# Patient Record
Sex: Male | Born: 1995 | Race: Black or African American | Hispanic: No | Marital: Single | Smoking: Never smoker
Health system: Southern US, Community
[De-identification: ages and names within clinical notes are randomized; demographics above are authoritative.]

## PROBLEM LIST (undated history)

## (undated) DIAGNOSIS — J45909 Unspecified asthma, uncomplicated: Secondary | ICD-10-CM

## (undated) HISTORY — PX: NO PAST SURGERIES: SHX2092

## (undated) HISTORY — DX: Unspecified asthma, uncomplicated: J45.909

---
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
## 2016-11-22 DIAGNOSIS — Z5321 Procedure and treatment not carried out due to patient leaving prior to being seen by health care provider: Secondary | ICD-10-CM | POA: Insufficient documentation

## 2016-11-22 DIAGNOSIS — R109 Unspecified abdominal pain: Secondary | ICD-10-CM | POA: Insufficient documentation

## 2016-11-23 ENCOUNTER — Emergency Department (HOSPITAL_COMMUNITY)
Admission: EM | Admit: 2016-11-23 | Discharge: 2016-11-23 | Disposition: A | Discharge status: Home / Self Care | Payer: Self-pay | Attending: Emergency Medicine | Admitting: Emergency Medicine

## 2016-11-23 ENCOUNTER — Encounter (HOSPITAL_COMMUNITY): Payer: Self-pay | Admitting: Emergency Medicine

## 2016-11-23 LAB — CBC
HEMATOCRIT: 41 % (ref 39.0–52.0)
HEMOGLOBIN: 14.2 g/dL (ref 13.0–17.0)
MCH: 25.2 pg — ABNORMAL LOW (ref 26.0–34.0)
MCHC: 34.6 g/dL (ref 30.0–36.0)
MCV: 72.8 fL — ABNORMAL LOW (ref 78.0–100.0)
Platelets: 159 10*3/uL (ref 150–400)
RBC: 5.63 MIL/uL (ref 4.22–5.81)
RDW: 13.3 % (ref 11.5–15.5)
WBC: 8 10*3/uL (ref 4.0–10.5)

## 2016-11-23 LAB — COMPREHENSIVE METABOLIC PANEL
ALBUMIN: 4.3 g/dL (ref 3.5–5.0)
ALT: 13 U/L — ABNORMAL LOW (ref 17–63)
ANION GAP: 9 (ref 5–15)
AST: 21 U/L (ref 15–41)
Alkaline Phosphatase: 77 U/L (ref 38–126)
BILIRUBIN TOTAL: 0.6 mg/dL (ref 0.3–1.2)
BUN: 14 mg/dL (ref 6–20)
CO2: 24 mmol/L (ref 22–32)
Calcium: 9.3 mg/dL (ref 8.9–10.3)
Chloride: 103 mmol/L (ref 101–111)
Creatinine, Ser: 1.46 mg/dL — ABNORMAL HIGH (ref 0.61–1.24)
GFR calc Af Amer: >60 mL/min (ref >60)
Glucose, Bld: 98 mg/dL (ref 65–99)
Potassium: 3.6 mmol/L (ref 3.5–5.1)
Sodium: 136 mmol/L (ref 135–145)
TOTAL PROTEIN: 7 g/dL (ref 6.5–8.1)

## 2016-11-23 LAB — URINALYSIS, ROUTINE W REFLEX MICROSCOPIC
Bilirubin Urine: NEGATIVE
Glucose, UA: NEGATIVE mg/dL
Hgb urine dipstick: NEGATIVE
Ketones, ur: NEGATIVE mg/dL
LEUKOCYTES UA: NEGATIVE
Nitrite: NEGATIVE
PH: 7 (ref 5.0–8.0)
Protein, ur: NEGATIVE mg/dL
SPECIFIC GRAVITY, URINE: 1.005 (ref 1.005–1.030)

## 2016-11-23 LAB — LIPASE, BLOOD: LIPASE: 152 U/L — AB (ref 11–51)

## 2016-11-23 NOTE — ED Triage Notes (Signed)
Pt c/o n/v and abdominal pain x 2 hours. States he is dehydrated.

## 2016-11-23 NOTE — ED Notes (Signed)
Pt came to nursing desk to use phone. Pt states " he has been waiting so long that he slept off his dehydration. All I have is a little headache and I can take care of that at home". Pt decided to leave AMA

## 2017-02-25 ENCOUNTER — Ambulatory Visit (INDEPENDENT_AMBULATORY_CARE_PROVIDER_SITE_OTHER): Payer: No Typology Code available for payment source | Admitting: Emergency Medicine

## 2017-02-25 ENCOUNTER — Encounter: Payer: Self-pay | Admitting: Emergency Medicine

## 2017-02-25 ENCOUNTER — Other Ambulatory Visit: Payer: Self-pay

## 2017-02-25 VITALS — BP 108/70 | HR 64 | Temp 98.8°F | Resp 16 | Ht 69.0 in | Wt 146.4 lb

## 2017-02-25 DIAGNOSIS — K05219 Aggressive periodontitis, localized, unspecified severity: Secondary | ICD-10-CM | POA: Insufficient documentation

## 2017-02-25 MED ORDER — CLINDAMYCIN HCL 300 MG PO CAPS: 300.0000 mg | ORAL_CAPSULE | Freq: Three times a day (TID) | ORAL | 0 refills | Status: AC

## 2017-02-25 NOTE — Patient Instructions (Addendum)
  Continue Ibuprofen and stop Doxycycline.   IF you received an x-ray today, you will receive an invoice from [facility]. Please contact [facility] at [phone] with questions or concerns regarding your invoice.   IF you received labwork today, you will receive an invoice from [facility]. Please contact [facility] at [phone] with questions or concerns regarding your invoice.   Our billing staff will not be able to assist you with questions regarding bills from these companies.  You will be contacted with the lab results as soon as they are available. The fastest way to get your results is to activate your My Chart account. Instructions are located on the last page of this paperwork. If you have not heard from us regarding the results in 2 weeks, please contact this office.     Dental Abscess A dental abscess is pus in or around a tooth. Follow these instructions at home:  Take medicines only as told by your dentist.  If you were prescribed antibiotic medicine, finish all of it even if you start to feel better.  Rinse your mouth (gargle) often with salt water.  Do not drive or use heavy machinery, like a lawn mower, while taking pain medicine.  Do not apply heat to the outside of your mouth.  Keep all follow-up visits as told by your dentist. This is important. Contact a doctor if:  Your pain is worse, and medicine does not help. Get help right away if:  You have a fever or chills.  Your symptoms suddenly get worse.  You have a very bad headache.  You have problems breathing or swallowing.  You have trouble opening your mouth.  You have puffiness (swelling) in your neck or around your eye. This information is not intended to replace advice given to you by your health care provider. Make sure you discuss any questions you have with your health care provider. Document Released: 08/15/2014 Document Revised: 09/06/2015 Document Reviewed:  03/28/2014 Elsevier Interactive Patient Education  [employer]2018 Elsevier Inc.

## 2017-02-25 NOTE — Progress Notes (Signed)
Jonathon DearJoshua Williamson 21 y.o.   Chief Complaint  Patient presents with  . Jaw Pain    per patient his top tooth x 2 days    HISTORY OF PRESENT ILLNESS: This is a 21 y.o. male complaining of right sided toothache and swelling to right side of face. Denies trauma. [facility].  HPI   Prior to Admission medications   Medication Sig Start Date End Date Taking? Authorizing Provider  ALBUTEROL IN Inhale as needed into the lungs.   Yes [provider]  DOXYCYCLINE PO Take 100 mg 2 (two) times daily by mouth.   Yes [provider]  Fluticasone Propionate, Inhal, (FLOVENT IN) Inhale daily into the lungs.   Yes [provider]  ibuprofen (ADVIL,MOTRIN) 800 MG tablet Take 800 mg every 8 (eight) hours as needed by mouth.   Yes [provider]    Allergies  Allergen Reactions  . Penicillins Hives    There are no active problems to display for this patient.   Past Medical History:  Diagnosis Date  . Asthma     No past surgical history on file.  Social History   Socioeconomic History  . Marital status: Single    Spouse name: Not on file  . Number of children: Not on file  . Years of education: Not on file  . Highest education level: Not on file  Social Needs  . Financial resource strain: Not on file  . Food insecurity - worry: Not on file  . Food insecurity - inability: Not on file  . Transportation needs - medical: Not on file  . Transportation needs - non-medical: Not on file  Occupational History  . Not on file  Tobacco Use  . Smoking status: Light Tobacco Smoker  . Smokeless tobacco: Never Used  Substance and Sexual Activity  . Alcohol use: Yes  . Drug use: Yes    Types: Marijuana  . Sexual activity: Not on file  Other Topics Concern  . Not on file  Social History Narrative  . Not on file    Family History  Problem Relation Age of Onset  . Diabetes Father      Review of  Systems  Constitutional: Negative.  Negative for chills and fever.  HENT: Negative for congestion, nosebleeds, sinus pain and sore throat.   Eyes: Negative for discharge and redness.  Respiratory: Negative for cough and shortness of breath.   Cardiovascular: Negative for chest pain.  Gastrointestinal: Negative for nausea and vomiting.  Skin: Negative for rash.  Neurological: Negative for dizziness and headaches.  Endo/Heme/Allergies: Negative.   All other systems reviewed and are negative.   Vitals:   02/25/17 1415  BP: 108/70  Pulse: 64  Resp: 16  Temp: 98.8 F (37.1 C)  SpO2: 97%    Physical Exam  Constitutional: He is oriented to person, place, and time. He appears well-developed and well-nourished.  HENT:  Head: Normocephalic and atraumatic.    Mouth/Throat:    Eyes: Conjunctivae and EOM are normal. Pupils are equal, round, and reactive to light.  Neck: Normal range of motion. Neck supple.  Cardiovascular: Normal rate and regular rhythm.  Pulmonary/Chest: Effort normal.  Musculoskeletal: Normal range of motion.  Neurological: He is alert and oriented to person, place, and time. No sensory deficit. He exhibits normal muscle tone.  Skin: Skin is warm and dry. Capillary refill takes less than 2 seconds.  Psychiatric: He has a normal mood and affect. His  behavior is normal.  Vitals reviewed.    ASSESSMENT & PLAN: Jonathon Williamson was seen today for jaw pain.  Diagnoses and all orders for this visit:  Acute periodontal abscess  Other orders -     clindamycin (CLEOCIN) 300 MG capsule; Take 1 capsule (300 mg total) 3 (three) times daily for 7 days by mouth.    Patient Instructions    Continue Ibuprofen and stop Doxycycline.   IF you received an x-ray today, you will receive an invoice from [facility]. Please contact [facility] at [phone] with questions or concerns regarding your invoice.   IF you received labwork today, you will receive an  invoice from [facility]. Please contact [facility] at [phone] with questions or concerns regarding your invoice.   Our billing staff will not be able to assist you with questions regarding bills from these companies.  You will be contacted with the lab results as soon as they are available. The fastest way to get your results is to activate your My Chart account. Instructions are located on the last page of this paperwork. If you have not heard from us regarding the results in 2 weeks, please contact this office.     Dental Abscess A dental abscess is pus in or around a tooth. Follow these instructions at home:  Take medicines only as told by your dentist.  If you were prescribed antibiotic medicine, finish all of it even if you start to feel better.  Rinse your mouth (gargle) often with salt water.  Do not drive or use heavy machinery, like a lawn mower, while taking pain medicine.  Do not apply heat to the outside of your mouth.  Keep all follow-up visits as told by your dentist. This is important. Contact a doctor if:  Your pain is worse, and medicine does not help. Get help right away if:  You have a fever or chills.  Your symptoms suddenly get worse.  You have a very bad headache.  You have problems breathing or swallowing.  You have trouble opening your mouth.  You have puffiness (swelling) in your neck or around your eye. This information is not intended to replace advice given to you by your health care provider. Make sure you discuss any questions you have with your health care provider. Document Released: 08/15/2014 Document Revised: 09/06/2015 Document Reviewed: 03/28/2014 Elsevier Interactive Patient Education  2018 Elsevier Inc.       Edwina BarthMiguel Joylene Wescott, MD [facility]

## 2018-11-22 ENCOUNTER — Encounter: Payer: Self-pay | Admitting: Family Medicine

## 2018-11-22 ENCOUNTER — Ambulatory Visit (INDEPENDENT_AMBULATORY_CARE_PROVIDER_SITE_OTHER): Payer: Self-pay | Admitting: Family Medicine

## 2018-11-22 ENCOUNTER — Other Ambulatory Visit: Payer: Self-pay

## 2018-11-22 DIAGNOSIS — Z7689 Persons encountering health services in other specified circumstances: Secondary | ICD-10-CM

## 2018-11-22 DIAGNOSIS — J4540 Moderate persistent asthma, uncomplicated: Secondary | ICD-10-CM

## 2018-11-22 DIAGNOSIS — J302 Other seasonal allergic rhinitis: Secondary | ICD-10-CM

## 2018-11-22 DIAGNOSIS — B009 Herpesviral infection, unspecified: Secondary | ICD-10-CM | POA: Insufficient documentation

## 2018-11-22 MED ORDER — ALBUTEROL SULFATE HFA 108 (90 BASE) MCG/ACT IN AERS: 2.0000 | INHALATION_SPRAY | Freq: Four times a day (QID) | RESPIRATORY_TRACT | 7 refills | Status: DC | PRN

## 2018-11-22 MED ORDER — VALACYCLOVIR HCL 500 MG PO TABS: 500.0000 mg | ORAL_TABLET | ORAL | 3 refills | Status: DC

## 2018-11-22 NOTE — Progress Notes (Signed)
Virtual Visit via Video Note  I connected with Jonathon Williamson on 11/22/18 at  3:00 PM EDT by a video enabled telemedicine application 2/2 [id number]-15 pandemic and verified that I am speaking with the correct person using two identifiers.  Location patient: home Location provider:work or home office Persons participating in the virtual visit: patient, provider  I discussed the limitations of evaluation and management by telemedicine and the availability of in person appointments. The patient expressed understanding and agreed to proceed.   HPI: Pt is a 23 yo male with pmh sig for asthma and HSV II.  [facility] [employer] [facility]  Asthma/allergies: -increased symptoms with allergies -pollen, dust, pet dander are triggers -using albuterol every few days  -using flovent  -taking zyrtec  HSV II: -no current outbreaks -valtrex 500 mg daily.  BID during an outbreak  Social Hx: Studied business at [employer].  Working a Engineer, materials.  Smokes cigars on wknd, maybe 3x/wk/  Drinking EtOH once q 2 wks, but may have 6 shots every few wknds.    Family hx: Dad- DM, HTN Mom-partial blind in one eye  ROS: See pertinent positives and negatives per HPI.  Past Medical History:  Diagnosis Date  . Asthma     No past surgical history on file.  Family History  Problem Relation Age of Onset  . Diabetes Father      Current Outpatient Medications:  .  ALBUTEROL IN, Inhale as needed into the lungs., Disp: , Rfl:  .  DOXYCYCLINE PO, Take 100 mg 2 (two) times daily by mouth., Disp: , Rfl:  .  Fluticasone Propionate, Inhal, (FLOVENT IN), Inhale daily into the lungs., Disp: , Rfl:  .  ibuprofen (ADVIL,MOTRIN) 800 MG tablet, Take 800 mg every 8 (eight) hours as needed by mouth., Disp: , Rfl:   EXAM:  VITALS per patient if applicable: RR between 17-61 bpm  GENERAL: alert, oriented, appears well and in no acute distress  HEENT: atraumatic, conjunctiva clear, no obvious  abnormalities on inspection of external nose and ears  NECK: normal movements of the head and neck  LUNGS: on inspection no signs of respiratory distress, breathing rate appears normal, no obvious gross SOB, gasping or wheezing  CV: no obvious cyanosis  MS: moves all visible extremities without noticeable abnormality  PSYCH/NEURO: pleasant and cooperative, no obvious depression or anxiety, speech and thought processing grossly intact  ASSESSMENT AND PLAN:  Discussed the following assessment and plan:  Moderate persistent asthma without complication -continue flovent and albuterol prn -avoid triggers when able - Plan: albuterol (VENTOLIN HFA) 108 (90 Base) MCG/ACT inhaler  Seasonal allergies  -continue zyrtec   HSV-2 infection  - Plan: valACYclovir (VALTREX) 500 MG tablet  Encounter to establish care  -We reviewed the PMH, PSH, FH, SH, Meds and Allergies. -We provided refills for any medications we will prescribe as needed. -We addressed current concerns per orders and patient instructions. -We have asked for records for pertinent exams, studies, vaccines and notes from previous providers. -We have advised patient to follow up per instructions below.  F/u prn   I discussed the assessment and treatment plan with the patient. The patient was provided an opportunity to ask questions and all were answered. The patient agreed with the plan and demonstrated an understanding of the instructions.   The patient was advised to call back or seek an in-person evaluation if the symptoms worsen or if the condition fails to improve as anticipated.   Billie Ruddy,  MD

## 2019-02-18 ENCOUNTER — Other Ambulatory Visit: Payer: Self-pay | Admitting: Family Medicine

## 2019-02-18 DIAGNOSIS — B009 Herpesviral infection, unspecified: Secondary | ICD-10-CM

## 2019-02-18 MED ORDER — VALACYCLOVIR HCL 500 MG PO TABS: 500.0000 mg | ORAL_TABLET | ORAL | 3 refills | Status: DC

## 2019-02-18 NOTE — Telephone Encounter (Signed)
Copied from [place] [id number]. Topic: Quick Communication - Rx Refill/Question >> Feb 18, 2019  1:39 PM Rainey Pines A wrote: Medication: valACYclovir (VALTREX) 500 MG tablet  Has the patient contacted their pharmacy? Yes (Agent: If no, request that the patient contact the pharmacy for the refill.) (Agent: If yes, when and what did the pharmacy advise?)Contact PCP  Preferred Pharmacy (with phone number or street name): [place], [place] [phone] (Phone) [phone] (Fax)    Agent: Please be advised that RX refills may take up to 3 business days. We ask that you follow-up with your pharmacy.

## 2019-03-02 ENCOUNTER — Telehealth: Payer: Self-pay | Admitting: Family Medicine

## 2019-03-02 NOTE — Telephone Encounter (Signed)
Okay for Nebulizer

## 2019-03-02 NOTE — Telephone Encounter (Signed)
Copied from [place] [id number]. Topic: General - Other >> Mar 02, 2019  3:08 PM Keene Breath wrote: Reason for CRM: Patient called to request an order for a nebulizer due to the flare up of his asthma.  He has been feeling  bad and would like to know if he can get the nebulizer.  CB# [phone]

## 2019-03-03 NOTE — Telephone Encounter (Signed)
Albuterol inhaler just as effective as a nebulizer when used correctly.  No indication for nebulizer.  Advise pt to stop smoking cigars and avoid other triggers.

## 2019-03-08 ENCOUNTER — Other Ambulatory Visit: Payer: Self-pay

## 2019-03-08 NOTE — Telephone Encounter (Signed)
Pt was notified of Dr Volanda Napoleon recommendation, verbalized understanding

## 2019-03-20 ENCOUNTER — Telehealth: Payer: Self-pay | Admitting: *Deleted

## 2019-03-20 NOTE — Telephone Encounter (Signed)
Patient called after hours line this morning. Patient reports he needs an inhaler called in. Shortness of breath. Has gotten inhaler in the past white with orange top. Likes the all blue one, ventolin? Feels like he is struggling for breath, worse when laying down, wakes up 2-3 times a night. Also reports productive cough.  Patient has been advised to proceed to ED.

## 2019-03-21 NOTE — Telephone Encounter (Signed)
Attempted to contact patient twice. Phone gave a busy signal both times.

## 2019-03-21 NOTE — Telephone Encounter (Signed)
Pt has rx for ventolin inhaler sent in 11/2018 with refills.  Needs to contact pharmacy.  If having increased SOB, cough, etc should be seen in person at [facility] or in ED.

## 2019-07-28 ENCOUNTER — Other Ambulatory Visit: Payer: Self-pay

## 2019-07-28 ENCOUNTER — Telehealth: Payer: Self-pay | Admitting: General Practice

## 2019-07-28 DIAGNOSIS — J4540 Moderate persistent asthma, uncomplicated: Secondary | ICD-10-CM

## 2019-07-28 MED ORDER — ALBUTEROL SULFATE HFA 108 (90 BASE) MCG/ACT IN AERS: 2.0000 | INHALATION_SPRAY | Freq: Four times a day (QID) | RESPIRATORY_TRACT | 3 refills | Status: DC | PRN

## 2019-07-28 NOTE — Telephone Encounter (Signed)
Pt is requesting a refill on Albuterol inhaler. Pt has not had his inhaler for a week. Pt uses [facility]. Thanks

## 2019-07-28 NOTE — Telephone Encounter (Signed)
Refill sent to pt pharmacy 

## 2020-03-22 ENCOUNTER — Other Ambulatory Visit: Payer: Self-pay | Admitting: Family Medicine

## 2020-03-22 DIAGNOSIS — B009 Herpesviral infection, unspecified: Secondary | ICD-10-CM

## 2020-03-22 NOTE — Telephone Encounter (Signed)
Pt needs appointment for further refill 

## 2020-07-21 ENCOUNTER — Encounter (HOSPITAL_BASED_OUTPATIENT_CLINIC_OR_DEPARTMENT_OTHER): Payer: Self-pay | Admitting: *Deleted

## 2020-07-21 ENCOUNTER — Emergency Department (HOSPITAL_BASED_OUTPATIENT_CLINIC_OR_DEPARTMENT_OTHER)
Admission: EM | Admit: 2020-07-21 | Discharge: 2020-07-22 | Disposition: A | Discharge status: Home / Self Care | Payer: Self-pay | Attending: Emergency Medicine | Admitting: Emergency Medicine

## 2020-07-21 ENCOUNTER — Other Ambulatory Visit: Payer: Self-pay

## 2020-07-21 ENCOUNTER — Emergency Department (HOSPITAL_BASED_OUTPATIENT_CLINIC_OR_DEPARTMENT_OTHER): Payer: Self-pay

## 2020-07-21 DIAGNOSIS — R0789 Other chest pain: Secondary | ICD-10-CM | POA: Insufficient documentation

## 2020-07-21 DIAGNOSIS — J4541 Moderate persistent asthma with (acute) exacerbation: Secondary | ICD-10-CM

## 2020-07-21 DIAGNOSIS — R000 Tachycardia, unspecified: Secondary | ICD-10-CM | POA: Insufficient documentation

## 2020-07-21 DIAGNOSIS — R062 Wheezing: Secondary | ICD-10-CM | POA: Insufficient documentation

## 2020-07-21 DIAGNOSIS — R059 Cough, unspecified: Secondary | ICD-10-CM | POA: Insufficient documentation

## 2020-07-21 DIAGNOSIS — R0602 Shortness of breath: Secondary | ICD-10-CM | POA: Insufficient documentation

## 2020-07-21 DIAGNOSIS — Z7951 Long term (current) use of inhaled steroids: Secondary | ICD-10-CM | POA: Insufficient documentation

## 2020-07-21 LAB — BASIC METABOLIC PANEL
Anion gap: 9 (ref 5–15)
BUN: 15 mg/dL (ref 6–20)
CO2: 26 mmol/L (ref 22–32)
Calcium: 8.7 mg/dL — ABNORMAL LOW (ref 8.9–10.3)
Chloride: 103 mmol/L (ref 98–111)
Creatinine, Ser: 1.28 mg/dL — ABNORMAL HIGH (ref 0.61–1.24)
GFR, Estimated: >60 mL/min (ref >60)
Glucose, Bld: 98 mg/dL (ref 70–99)
Potassium: 3.6 mmol/L (ref 3.5–5.1)
Sodium: 138 mmol/L (ref 135–145)

## 2020-07-21 LAB — CBC WITH DIFFERENTIAL/PLATELET
Abs Immature Granulocytes: 0.02 10*3/uL (ref 0.00–0.07)
Basophils Absolute: 0 10*3/uL (ref 0.0–0.1)
Basophils Relative: 0 %
Eosinophils Absolute: 0.2 10*3/uL (ref 0.0–0.5)
Eosinophils Relative: 2 %
HCT: 43.3 % (ref 39.0–52.0)
Hemoglobin: 14.1 g/dL (ref 13.0–17.0)
Immature Granulocytes: 0 %
Lymphocytes Relative: 25 %
Lymphs Abs: 1.7 10*3/uL (ref 0.7–4.0)
MCH: 25 pg — ABNORMAL LOW (ref 26.0–34.0)
MCHC: 32.6 g/dL (ref 30.0–36.0)
MCV: 76.9 fL — ABNORMAL LOW (ref 80.0–100.0)
Monocytes Absolute: 0.5 10*3/uL (ref 0.1–1.0)
Monocytes Relative: 7 %
Neutro Abs: 4.5 10*3/uL (ref 1.7–7.7)
Neutrophils Relative %: 66 %
Platelets: 162 10*3/uL (ref 150–400)
RBC: 5.63 MIL/uL (ref 4.22–5.81)
RDW: 13.4 % (ref 11.5–15.5)
WBC: 6.8 10*3/uL (ref 4.0–10.5)
nRBC: 0 % (ref 0.0–0.2)

## 2020-07-21 MED ORDER — SODIUM CHLORIDE 0.9 % IV BOLUS
500.0000 mL | Freq: Once | INTRAVENOUS | Status: AC
  Administered 2020-07-21: 500 mL via INTRAVENOUS

## 2020-07-21 MED ORDER — ALBUTEROL SULFATE (2.5 MG/3ML) 0.083% IN NEBU
INHALATION_SOLUTION | RESPIRATORY_TRACT | Status: AC
Start: 2020-07-21 — End: 2020-07-21
  Administered 2020-07-21: 2.5 mg via RESPIRATORY_TRACT
  Filled 2020-07-21: qty 3

## 2020-07-21 MED ORDER — IPRATROPIUM-ALBUTEROL 0.5-2.5 (3) MG/3ML IN SOLN: 3.0000 mL | Freq: Once | RESPIRATORY_TRACT | Status: DC

## 2020-07-21 MED ORDER — ALBUTEROL SULFATE (2.5 MG/3ML) 0.083% IN NEBU: 2.5000 mg | INHALATION_SOLUTION | Freq: Once | RESPIRATORY_TRACT | Status: AC

## 2020-07-21 MED ORDER — MAGNESIUM SULFATE 2 GM/50ML IV SOLN
2.0000 g | Freq: Once | INTRAVENOUS | Status: AC
  Administered 2020-07-21: 2 g via INTRAVENOUS
  Filled 2020-07-21: qty 50

## 2020-07-21 MED ORDER — ALBUTEROL SULFATE HFA 108 (90 BASE) MCG/ACT IN AERS: 2.0000 | INHALATION_SPRAY | Freq: Once | RESPIRATORY_TRACT | Status: DC

## 2020-07-21 MED ORDER — ALBUTEROL SULFATE (2.5 MG/3ML) 0.083% IN NEBU: 5.0000 mg | INHALATION_SOLUTION | Freq: Once | RESPIRATORY_TRACT | Status: AC

## 2020-07-21 MED ORDER — ALBUTEROL (5 MG/ML) CONTINUOUS INHALATION SOLN
10.0000 mg/h | INHALATION_SOLUTION | RESPIRATORY_TRACT | Status: AC
  Administered 2020-07-21: 10 mg/h via RESPIRATORY_TRACT
  Filled 2020-07-21: qty 20

## 2020-07-21 MED ORDER — IPRATROPIUM-ALBUTEROL 0.5-2.5 (3) MG/3ML IN SOLN: 3.0000 mL | Freq: Once | RESPIRATORY_TRACT | Status: AC

## 2020-07-21 MED ORDER — IPRATROPIUM-ALBUTEROL 0.5-2.5 (3) MG/3ML IN SOLN
RESPIRATORY_TRACT | Status: AC
  Administered 2020-07-21: 3 mL via RESPIRATORY_TRACT
  Filled 2020-07-21: qty 3

## 2020-07-21 MED ORDER — ALBUTEROL SULFATE HFA 108 (90 BASE) MCG/ACT IN AERS
INHALATION_SPRAY | RESPIRATORY_TRACT | Status: AC
  Administered 2020-07-21: 2 via RESPIRATORY_TRACT
  Filled 2020-07-21: qty 6.7

## 2020-07-21 MED ORDER — ALBUTEROL SULFATE (2.5 MG/3ML) 0.083% IN NEBU
INHALATION_SOLUTION | RESPIRATORY_TRACT | Status: AC
  Administered 2020-07-21: 5 mg via RESPIRATORY_TRACT
  Filled 2020-07-21: qty 6

## 2020-07-21 MED ORDER — PREDNISONE 10 MG PO TABS: 40.0000 mg | ORAL_TABLET | Freq: Every day | ORAL | 0 refills | Status: DC

## 2020-07-21 MED ORDER — ALBUTEROL SULFATE HFA 108 (90 BASE) MCG/ACT IN AERS: 2.0000 | INHALATION_SPRAY | Freq: Four times a day (QID) | RESPIRATORY_TRACT | Status: DC | PRN

## 2020-07-21 MED ORDER — PREDNISONE 20 MG PO TABS
40.0000 mg | ORAL_TABLET | Freq: Once | ORAL | Status: AC
  Administered 2020-07-21: 40 mg via ORAL
  Filled 2020-07-21: qty 2

## 2020-07-21 NOTE — ED Triage Notes (Signed)
Pt reports hx of asthma. 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 Used home inhaler without relief. Eval by RT in triage

## 2020-07-21 NOTE — ED Provider Notes (Signed)
[facility] EMERGENCY DEPARTMENT Provider Note   CSN: [id number] Arrival date & time: 07/21/20  2108     History Chief Complaint  Patient presents with  . Asthma    Kaelob Persky is a 25 y.o. male.  The history is provided by the patient and medical records.  Asthma   Sota Hetz is a 25 y.o. male who presents to the Emergency Department complaining of shortness of breath. He has a history of asthma and normally uses a rescue inhaler 3 to 4 times weekly. 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 He has chest tightness with mild cough. He tried his rescue inhaler with no significant improvement in symptoms. He denies any fevers, nausea, vomiting, abdominal pain, leg swelling or pain. Two years ago he was on Flovent but he lost his insurance and has not been able to follow-up with PCP since that time. He does smoke tobacco. No recent illnesses.    Past Medical History:  Diagnosis Date  . Asthma     Patient Active Problem List   Diagnosis Date Noted  . Seasonal allergies 11/22/2018  . HSV-2 infection 11/22/2018  . Moderate persistent asthma without complication 11/22/2018  . Acute periodontal abscess 02/25/2017    No past surgical history on file.     Family History  Problem Relation Age of Onset  . Diabetes Father     Social History   Tobacco Use  . Smoking status: Never Smoker  . Smokeless tobacco: Never Used  Substance Use Topics  . Alcohol use: Yes  . Drug use: Yes    Types: Marijuana    Home Medications Prior to Admission medications   Medication Sig Start Date End Date Taking? Authorizing Provider  albuterol (VENTOLIN HFA) 108 (90 Base) MCG/ACT inhaler Inhale 2 puffs into the lungs every 6 (six) hours as needed for wheezing or shortness of breath. 07/28/19   Deeann Saint, MD  ALBUTEROL IN Inhale as needed into the lungs.    [provider]  DOXYCYCLINE PO  Take 100 mg 2 (two) times daily by mouth.    [provider]  Fluticasone Propionate, Inhal, (FLOVENT IN) Inhale daily into the lungs.    [provider]  ibuprofen (ADVIL,MOTRIN) 800 MG tablet Take 800 mg every 8 (eight) hours as needed by mouth.    [provider]  valACYclovir (VALTREX) 500 MG tablet TAKE 1 TABLET BY MOUTH AS DIRECTED 03/22/20   Deeann Saint, MD    Allergies    Penicillins  Review of Systems   Review of Systems  All other systems reviewed and are negative.   Physical Exam Updated Vital Signs BP (!) 146/80   Pulse (!) 106   Temp 98.1 F (36.7 C) (Oral)   Resp 20   Ht 5\' 9"  (1.753 m)   Wt 68 kg   SpO2 99%   BMI 22.15 kg/m   Physical Exam Vitals and nursing note reviewed.  Constitutional:      Appearance: He is well-developed.  HENT:     Head: Normocephalic and atraumatic.  Cardiovascular:     Rate and Rhythm: Regular rhythm. Tachycardia present.     Heart sounds: No murmur heard.   Pulmonary:     Effort: Pulmonary effort is normal. No respiratory distress.     Comments: Good air movement bilaterally. end expiratory wheezes bilaterally Abdominal:     Palpations: Abdomen is soft.  Tenderness: There is no abdominal tenderness. There is no guarding or rebound.  Musculoskeletal:        General: No tenderness.  Skin:    General: Skin is warm and dry.  Neurological:     Mental Status: He is alert and oriented to person, place, and time.  Psychiatric:        Behavior: Behavior normal.     ED Results / Procedures / Treatments   Labs (all labs ordered are listed, but only abnormal results are displayed) Labs Reviewed - No data to display  EKG None  Radiology No results found.  Procedures Procedures   Medications Ordered in ED Medications  albuterol (PROVENTIL) (2.5 MG/3ML) 0.083% nebulizer solution 5 mg (has no administration in time range)  albuterol (VENTOLIN HFA) 108 (90 Base) MCG/ACT inhaler 2 puff  (has no administration in time range)  albuterol (PROVENTIL) (2.5 MG/3ML) 0.083% nebulizer solution (has no administration in time range)  albuterol (VENTOLIN HFA) 108 (90 Base) MCG/ACT inhaler (has no administration in time range)  ipratropium-albuterol (DUONEB) 0.5-2.5 (3) MG/3ML nebulizer solution 3 mL (3 mLs Nebulization Given 07/21/20 2130)  albuterol (PROVENTIL) (2.5 MG/3ML) 0.083% nebulizer solution 2.5 mg (2.5 mg Nebulization Given 07/21/20 2130)  predniSONE (DELTASONE) tablet 40 mg (40 mg Oral Given 07/21/20 2209)    ED Course  I have reviewed the triage vital signs and the nursing notes.  Pertinent labs & imaging results that were available during my care of the patient were reviewed by me and considered in my medical decision making (see chart for details).    MDM Rules/Calculators/A&P                         Patient with history of asthma here for evaluation of cough, shortness of breath. On initial presentation patient with end expiratory wheezes. Repeat assessment after albuterol patient without significant improvement, has persistent wheezing and is diminished on repeat evaluation. Plan to treat with hour-long neb and reassess. Will add on IV magnesium, chest x-ray due to failure to significantly improve following initial treatment. Patient care transferred pending recheck after hour-long neb.  Final Clinical Impression(s) / ED Diagnoses Final diagnoses:  None    Rx / DC Orders ED Discharge Orders    None       Tilden Fossa, MD 07/22/20 0001

## 2020-07-24 NOTE — Progress Notes (Signed)
.   Transition of Care [facility]) - Emergency Department Mini Assessment   Patient Details  Name: Jonathon Williamson MRN: [id number] Date of Birth: 09-09-1995  [facility]) CM/SW Contact:    Elliot Cousin, RN Phone Number: [phone]  07/24/2020, 4:55 PM   Clinical Narrative:  TOC CM spoke to pt and he did pick up his medications. Appt arranged for [facility] for 5/26 at 1:50 pm. Updated pt that he can utilize the [facility] pharmacy for his meds at a discounted price. Contacted pt with appt time. ED provider updated.   ED Mini Assessment: What brought you to the Emergency Department? : asthma  Barriers to Discharge: No Barriers Identified  Barrier interventions: discussed follow up with PCP, and medications  Means of departure: Car  Interventions which prevented an admission or readmission: Medication Review,Follow-up medical appointment    Patient Contact and Communications        ,                 Admission diagnosis:  asthma Patient Active Problem List   Diagnosis Date Noted  . Seasonal allergies 11/22/2018  . HSV-2 infection 11/22/2018  . Moderate persistent asthma without complication 11/22/2018  . Acute periodontal abscess 02/25/2017   PCP:  Deeann Saint, MD Pharmacy:   [facility] [phone] - [place], [place] - [address] AT [facility] [address] [place] [address] Phone: [phone] Fax: [phone]  [facility] [place], [place] - [address] [place] [address] Phone: [phone] Fax: [phone]  [facility], [place] - [address] [facility] [place] [address] Phone: [phone] Fax: [phone]  [id number] [place], [place] - [address] AT [facility] OF [address] [place] [address] Phone: [phone] Fax:  [phone]

## 2020-07-25 ENCOUNTER — Other Ambulatory Visit: Payer: Self-pay | Admitting: Family Medicine

## 2020-07-25 DIAGNOSIS — B009 Herpesviral infection, unspecified: Secondary | ICD-10-CM

## 2020-07-25 NOTE — Telephone Encounter (Signed)
Okay to refill?  LOV 11/22/18  Last refill 03/22/20

## 2020-09-04 NOTE — Progress Notes (Signed)
Subjective:    Jonathon Williamson - 25 y.o. male MRN [id number]  Date of birth: Jul 28, 1995  HPI  [name] is to establish care and hospital discharge follow-up. Patient has a PMH significant for moderate persistent asthma without complication, acute periodontal abscess, seasonal allergies, and HSV-2 infection.   Visit 07/21/2020 - 07/22/2020 at [facility] Emergency Department per MD note:  Patient with history of asthma here for evaluation of cough, shortness of breath. On initial presentation patient with end expiratory wheezes. Repeat assessment after albuterol patient without significant improvement, has persistent wheezing and is diminished on repeat evaluation. Plan to treat with hour-long neb and reassess. Will add on IV magnesium, chest x-ray due to failure to significantly improve following initial treatment. Patient care transferred pending recheck after hour-long neb.  09/05/2020 ASTHMA FOLLOW-UP: Albuterol/rescue inhaler frequency: about 8 times weekly, requesting to add Flovent Dyspnea frequency: since hospital discharge back to baseline Wheezing frequency: since hospital discharge back to baseline  Cough frequency: since hospital discharge back to baseline  Limitation of activity: no Triggers: dust, pollen, pet dander, cigarette smoke, strong perfume   Comments: Requesting refills on Flovent and Albuterol inhalers. Reports has not taken Flovent inhaler for several years because he lost follow-up of care with previous primary provider. Reports overall feeling well today.  2. FINANCIAL CONCERN: Requesting information about health insurance assistance.   ROS per HPI    Health Maintenance:  Health Maintenance Due  Topic Date Due  . COVID-19 Vaccine (1) Never done  . HPV VACCINES (1 - Male 2-dose series) Never done  . HIV Screening  Never done  . Hepatitis C Screening  Never done  . TETANUS/TDAP  Never done    Past Medical History: Patient Active Problem  List   Diagnosis Date Noted  . Seasonal allergies 11/22/2018  . HSV-2 infection 11/22/2018  . Moderate persistent asthma without complication 11/22/2018  . Acute periodontal abscess 02/25/2017    Social History   reports that he has never smoked. He has never used smokeless tobacco. He reports current alcohol use of about 2.0 standard drinks of alcohol per week. He reports current drug use. Drug: Marijuana.   Family History  family history includes Diabetes in his father.   Medications: reviewed and updated   Objective:   Physical Exam BP 135/89 (BP Location: Left Arm, Patient Position: Sitting, Cuff Size: Normal)   Pulse 60   Temp 98.4 F (36.9 C)   Resp 14   Ht 5' 8.94" (1.751 m)   Wt 143 lb (64.9 kg)   SpO2 99%   BMI 21.16 kg/m  Physical Exam Constitutional:      Appearance: He is normal weight.  HENT:     Head: Normocephalic and atraumatic.  Eyes:     Extraocular Movements: Extraocular movements intact.     Conjunctiva/sclera: Conjunctivae normal.     Pupils: Pupils are equal, round, and reactive to light.  Cardiovascular:     Rate and Rhythm: Normal rate and regular rhythm.     Pulses: Normal pulses.     Heart sounds: Normal heart sounds.  Pulmonary:     Effort: Pulmonary effort is normal.     Breath sounds: Normal breath sounds.  Musculoskeletal:     Cervical back: Normal range of motion and neck supple.  Neurological:     General: No focal deficit present.     Mental Status: He is alert and oriented to person, place, and time.  Psychiatric:  Mood and Affect: Mood normal.        Behavior: Behavior normal.      Assessment & Plan:  1. Encounter to establish care: - Patient presents today to establish care.  - Return for annual physical examination, labs, and health maintenance. Arrive fasting meaning having no food for at least 8 hours prior to appointment. You may have only water or black coffee. Please take scheduled medications as normal.  2.  Hospital discharge follow-up: 3. Moderate persistent asthma without complication: - Patient reports feeling well since hospital discharge.  - Continue Albuterol inhaler as prescribed.  - Begin Fluticasone inhaler as prescribed.  - Follow-up with primary provider as scheduled.  - albuterol (VENTOLIN HFA) 108 (90 Base) MCG/ACT inhaler; Inhale 2 puffs into the lungs every 6 (six) hours as needed for wheezing or shortness of breath.  Dispense: 8 g; Refill: 1 - fluticasone (FLOVENT HFA) 110 MCG/ACT inhaler; Inhale 2 puffs into the lungs daily.  Dispense: 1 each; Refill: 12  4. Financial difficulty: - Offered patient [employer] financial discount/orange card and blue card application. Counseled patient will need to have an appointment with the financial counselor for processing of documentation. Patient agreeable.     Patient was given clear instructions to go to Emergency Department or return to medical center if symptoms don't improve, worsen, or new problems develop.The patient verbalized understanding.  I discussed the assessment and treatment plan with the patient. The patient was provided an opportunity to ask questions and all were answered. The patient agreed with the plan and demonstrated an understanding of the instructions.   The patient was advised to call back or seek an in-person evaluation if the symptoms worsen or if the condition fails to improve as anticipated.    Ricky Stabs, NP 09/05/2020, 2:13 PM Primary Care at [facility]

## 2020-09-05 ENCOUNTER — Encounter: Payer: Self-pay | Admitting: Family

## 2020-09-05 ENCOUNTER — Other Ambulatory Visit: Payer: Self-pay

## 2020-09-05 ENCOUNTER — Ambulatory Visit (INDEPENDENT_AMBULATORY_CARE_PROVIDER_SITE_OTHER): Payer: Self-pay | Admitting: Family

## 2020-09-05 VITALS — BP 135/89 | HR 60 | Temp 98.4°F | Resp 14 | Ht 68.94 in | Wt 143.0 lb

## 2020-09-05 DIAGNOSIS — Z599 Problem related to housing and economic circumstances, unspecified: Secondary | ICD-10-CM

## 2020-09-05 DIAGNOSIS — Z7689 Persons encountering health services in other specified circumstances: Secondary | ICD-10-CM

## 2020-09-05 DIAGNOSIS — J4540 Moderate persistent asthma, uncomplicated: Secondary | ICD-10-CM

## 2020-09-05 DIAGNOSIS — Z09 Encounter for follow-up examination after completed treatment for conditions other than malignant neoplasm: Secondary | ICD-10-CM

## 2020-09-05 MED ORDER — ALBUTEROL SULFATE HFA 108 (90 BASE) MCG/ACT IN AERS
2.0000 | INHALATION_SPRAY | Freq: Four times a day (QID) | RESPIRATORY_TRACT | 1 refills | Status: DC | PRN
  Filled 2020-09-05: qty 18, 25d supply, fill #0
  Filled 2020-11-07: qty 18, 25d supply, fill #1

## 2020-09-05 MED ORDER — FLUTICASONE PROPIONATE HFA 110 MCG/ACT IN AERO
2.0000 | INHALATION_SPRAY | Freq: Every day | RESPIRATORY_TRACT | 12 refills | Status: DC
  Filled 2020-09-05: qty 12, 30d supply, fill #0
  Filled 2020-09-17 (×2): qty 12, 30d supply, fill #1

## 2020-09-05 NOTE — Patient Instructions (Signed)
Asthma, Adult  Asthma is a long-term (chronic) condition in which the airways get tight and narrow. The airways are the breathing passages that lead from the nose and mouth down into the lungs. A person with asthma will have times when symptoms get worse. These are called asthma attacks. They can cause coughing, whistling sounds when you breathe (wheezing), shortness of breath, and chest pain. They can make it hard to breathe. There is no cure for asthma, but medicines and lifestyle changes can help control it. There are many things that can bring on an asthma attack or make asthma symptoms worse (triggers). Common triggers include:  Mold.  Dust.  Cigarette smoke.  Cockroaches.  Things that can cause allergy symptoms (allergens). These include animal skin flakes (dander) and pollen from trees or grass.  Things that pollute the air. These may include household cleaners, wood smoke, smog, or chemical odors.  Cold air, weather changes, and wind.  Crying or laughing hard.  Stress.  Certain medicines or drugs.  Certain foods such as dried fruit, potato chips, and grape juice.  Infections, such as a cold or the flu.  Certain medical conditions or diseases.  Exercise or tiring activities. Asthma may be treated with medicines and by staying away from the things that cause asthma attacks. Types of medicines may include:  Controller medicines. These help prevent asthma symptoms. They are usually taken every day.  Fast-acting reliever or rescue medicines. These quickly relieve asthma symptoms. They are used as needed and provide short-term relief.  Allergy medicines if your attacks are brought on by allergens.  Medicines to help control the body's defense (immune) system. Follow these instructions at home: Avoiding triggers in your home  Change your heating and air conditioning filter often.  Limit your use of fireplaces and wood stoves.  Get rid of pests (such as roaches and  mice) and their droppings.  Throw away plants if you see mold on them.  Clean your floors. Dust regularly. Use cleaning products that do not smell.  Have someone vacuum when you are not home. Use a vacuum cleaner with a HEPA filter if possible.  Replace carpet with wood, tile, or vinyl flooring. Carpet can trap animal skin flakes and dust.  Use allergy-proof pillows, mattress covers, and box spring covers.  Wash bed sheets and blankets every week in hot water. Dry them in a dryer.  Keep your bedroom free of any triggers.  Avoid pets and keep windows closed when things that cause allergy symptoms are in the air.  Use blankets that are made of polyester or cotton.  Clean bathrooms and kitchens with bleach. If possible, have someone repaint the walls in these rooms with mold-resistant paint. Keep out of the rooms that are being cleaned and painted.  Wash your hands often with soap and water. If soap and water are not available, use hand sanitizer.  Do not allow anyone to smoke in your home. General instructions  Take over-the-counter and prescription medicines only as told by your doctor. ? Talk with your doctor if you have questions about how or when to take your medicines. ? Make note if you need to use your medicines more often than usual.  Do not use any products that contain nicotine or tobacco, such as cigarettes and e-cigarettes. If you need help quitting, ask your doctor.  Stay away from secondhand smoke.  Avoid doing things outdoors when allergen counts are high and when air quality is low.  Wear a ski mask   when doing outdoor activities in the winter. The mask should cover your nose and mouth. Exercise indoors on cold days if you can.  Warm up before you exercise. Take time to cool down after exercise.  Use a peak flow meter as told by your doctor. A peak flow meter is a tool that measures how well the lungs are working.  Keep track of the peak flow meter's readings.  Write them down.  Follow your asthma action plan. This is a written plan for taking care of your asthma and treating your attacks.  Make sure you get all the shots (vaccines) that your doctor recommends. Ask your doctor about a flu shot and a pneumonia shot.  Keep all follow-up visits as told by your doctor. This is important. Contact a doctor if:  You have wheezing, shortness of breath, or a cough even while taking medicine to prevent attacks.  The mucus you cough up (sputum) is thicker than usual.  The mucus you cough up changes from clear or white to yellow, green, gray, or bloody.  You have problems from the medicine you are taking, such as: ? A rash. ? Itching. ? Swelling. ? Trouble breathing.  You need reliever medicines more than 2-3 times a week.  Your peak flow reading is still at 50-79% of your personal best after following the action plan for 1 hour.  You have a fever. Get help right away if:  You seem to be worse and are not responding to medicine during an asthma attack.  You are short of breath even at rest.  You get short of breath when doing very little activity.  You have trouble eating, drinking, or talking.  You have chest pain or tightness.  You have a fast heartbeat.  Your lips or fingernails start to turn blue.  You are light-headed or dizzy, or you faint.  Your peak flow is less than 50% of your personal best.  You feel too tired to breathe normally. Summary  Asthma is a long-term (chronic) condition in which the airways get tight and narrow. An asthma attack can make it hard to breathe.  Asthma cannot be cured, but medicines and lifestyle changes can help control it.  Make sure you understand how to avoid triggers and how and when to use your medicines. This information is not intended to replace advice given to you by your health care provider. Make sure you discuss any questions you have with your health care provider. Document Revised:  08/03/2019 Document Reviewed: 08/03/2019 Elsevier Patient Education  2021 Elsevier Inc.   

## 2020-09-05 NOTE — Progress Notes (Signed)
ED f/u  Pt needs Flovent inhaler refilled and sent into [facility] pharmacy -did not have insurance coverage to have refilled  Needs albuterol rescue inhaler refilled as well

## 2020-09-06 ENCOUNTER — Inpatient Hospital Stay: Payer: Medicaid Other | Admitting: Physician Assistant

## 2020-09-09 NOTE — Progress Notes (Signed)
Virtual Visit via Telephone Note  I connected with Jonathon Williamson, on 09/11/2020 at 12:11 PM by telephone due to the COVID-19 pandemic and verified that I am speaking with the correct person using two identifiers.  Due to current restrictions/limitations of in-office visits due to the COVID-19 pandemic, this scheduled clinical appointment was converted to a telehealth visit.   Consent: I discussed the limitations, risks, security and privacy concerns of performing an evaluation and management service by telephone and the availability of in person appointments. I also discussed with the patient that there may be a patient responsible charge related to this service. The patient expressed understanding and agreed to proceed.   Location of Patient: Home  Location of Provider: [facility]   Persons participating in Telemedicine visit: Gerrell Tabet Ricky Stabs, NP Margorie John, CMA  History of Present Illness: Jonathon Williamson is a 24 year-old male who presents to establish care. PMH significant for asthma, moderate persistent asthma without complication, acute periodontal abscess, seasonal allergies, and HSV-2 infection.   Current issues and/or concerns: None.    Past Medical History:  Diagnosis Date  . Asthma    Allergies  Allergen Reactions  . Peanut Oil Itching  . Penicillins Hives    Current Outpatient Medications on File Prior to Visit  Medication Sig Dispense Refill  . albuterol (VENTOLIN HFA) 108 (90 Base) MCG/ACT inhaler Inhale 2 puffs into the lungs every 6 (six) hours as needed for wheezing or shortness of breath. 18 g 1  . fluticasone (FLOVENT HFA) 110 MCG/ACT inhaler Inhale 2 puffs into the lungs daily. 12 g 12  . ibuprofen (ADVIL,MOTRIN) 800 MG tablet Take 800 mg every 8 (eight) hours as needed by mouth.    . valACYclovir (VALTREX) 500 MG tablet TAKE ONE TABLET BY MOUTH DAILY AS DIRECTED 90 tablet 0   No current  facility-administered medications on file prior to visit.    Observations/Objective: Alert and oriented x 3. Not in acute distress. Physical examination not completed as this is a telemedicine visit.  Assessment and Plan: 1. Encounter to establish care: - Patient presents today to establish care.  - Return for annual physical examination, labs, and health maintenance. Arrive fasting meaning having no food for at least 8 hours prior to appointment. You may have only water or black coffee. Please take scheduled medications as normal.   Follow Up Instructions: Return for annual physical exam.    Patient was given clear instructions to go to Emergency Department or return to medical center if symptoms don't improve, worsen, or new problems develop.The patient verbalized understanding.  I discussed the assessment and treatment plan with the patient. The patient was provided an opportunity to ask questions and all were answered. The patient agreed with the plan and demonstrated an understanding of the instructions.   The patient was advised to call back or seek an in-person evaluation if the symptoms worsen or if the condition fails to improve as anticipated.   I provided 5 minutes total of non-face-to-face time during this encounter.   Rema Fendt, NP  [facility] Primary Care at [facility] [place], [place] [phone] 09/11/2020, 12:11 PM

## 2020-09-11 ENCOUNTER — Encounter: Payer: Self-pay | Admitting: Family

## 2020-09-11 ENCOUNTER — Telehealth (INDEPENDENT_AMBULATORY_CARE_PROVIDER_SITE_OTHER): Payer: Self-pay | Admitting: Family

## 2020-09-11 ENCOUNTER — Other Ambulatory Visit: Payer: Self-pay

## 2020-09-11 DIAGNOSIS — Z7689 Persons encountering health services in other specified circumstances: Secondary | ICD-10-CM

## 2020-09-11 NOTE — Progress Notes (Signed)
Establish care Pt needs physical  

## 2020-09-17 ENCOUNTER — Ambulatory Visit: Payer: Self-pay

## 2020-09-17 ENCOUNTER — Other Ambulatory Visit: Payer: Self-pay

## 2020-10-16 NOTE — Progress Notes (Signed)
Patient ID: Jonathon Williamson, male    DOB: 1995-05-04  MRN: [id number]  CC: Annual Physical Exam  Subjective: Jonathon Williamson is a 25 y.o. male who presents for annual physical exam.   His concerns today include:   Requesting STD testing, denies any symptoms. Requesting refill of Valtrex, endorses genital related herpes.   Patient Active Problem List   Diagnosis Date Noted  . Seasonal allergies 11/22/2018  . HSV-2 infection 11/22/2018  . Moderate persistent asthma without complication 11/22/2018  . Acute periodontal abscess 02/25/2017     Current Outpatient Medications on File Prior to Visit  Medication Sig Dispense Refill  . albuterol (VENTOLIN HFA) 108 (90 Base) MCG/ACT inhaler Inhale 2 puffs into the lungs every 6 (six) hours as needed for wheezing or shortness of breath. 18 g 1  . fluticasone (FLOVENT HFA) 110 MCG/ACT inhaler Inhale 2 puffs into the lungs daily. 12 g 12  . ibuprofen (ADVIL,MOTRIN) 800 MG tablet Take 800 mg every 8 (eight) hours as needed by mouth.     No current facility-administered medications on file prior to visit.    Allergies  Allergen Reactions  . Peanut Oil Itching  . Penicillins Hives    Social History   Socioeconomic History  . Marital status: Single    Spouse name: Not on file  . Number of children: Not on file  . Years of education: Not on file  . Highest education level: Not on file  Occupational History  . Not on file  Tobacco Use  . Smoking status: Never  . Smokeless tobacco: Never  Vaping Use  . Vaping Use: Never used  Substance and Sexual Activity  . Alcohol use: Yes    Alcohol/week: 2.0 standard drinks    Types: 2 Standard drinks or equivalent per week  . Drug use: Yes    Types: Marijuana  . Sexual activity: Not on file  Other Topics Concern  . Not on file  Social History Narrative  . Not on file   Social Determinants of Health   Financial Resource Strain: Not on file  Food Insecurity: Not on file   Transportation Needs: Not on file  Physical Activity: Not on file  Stress: Not on file  Social Connections: Not on file  Intimate Partner Violence: Not on file    Family History  Problem Relation Age of Onset  . Diabetes Father     Past Surgical History:  Procedure Laterality Date  . NO PAST SURGERIES      ROS: Review of Systems Negative except as stated above  PHYSICAL EXAM: BP (!) 132/91 (BP Location: Left Arm, Patient Position: Sitting, Cuff Size: Normal)   Pulse 81   Temp 98.4 F (36.9 C)   Resp 15   Ht 5' 8.94" (1.751 m)   Wt 143 lb 12.8 oz (65.2 kg)   SpO2 98%   BMI 21.27 kg/m   Physical Exam HENT:     Head: Normocephalic and atraumatic.     Right Ear: Tympanic membrane, ear canal and external ear normal.     Left Ear: Tympanic membrane, ear canal and external ear normal.  Eyes:     Extraocular Movements: Extraocular movements intact.     Conjunctiva/sclera: Conjunctivae normal.     Pupils: Pupils are equal, round, and reactive to light.  Cardiovascular:     Rate and Rhythm: Normal rate and regular rhythm.  Pulmonary:     Effort: Pulmonary effort is normal.     Breath sounds: Normal  breath sounds.  Abdominal:     General: Bowel sounds are normal.     Palpations: Abdomen is soft.  Genitourinary:    Comments: Patient declined exam. Musculoskeletal:        General: Normal range of motion.     Cervical back: Normal range of motion and neck supple.  Skin:    General: Skin is warm and dry.     Capillary Refill: Capillary refill takes less than 2 seconds.  Neurological:     General: No focal deficit present.     Mental Status: He is alert and oriented to person, place, and time.  Psychiatric:        Mood and Affect: Mood normal.        Behavior: Behavior normal.   Physical Exam HENT:     Head: Normocephalic and atraumatic.     Right Ear: Tympanic membrane, ear canal and external ear normal.     Left Ear: Tympanic membrane, ear canal and external  ear normal.  Eyes:     Extraocular Movements: Extraocular movements intact.     Conjunctiva/sclera: Conjunctivae normal.     Pupils: Pupils are equal, round, and reactive to light.  Cardiovascular:     Rate and Rhythm: Normal rate and regular rhythm.  Pulmonary:     Effort: Pulmonary effort is normal.     Breath sounds: Normal breath sounds.  Abdominal:     General: Bowel sounds are normal.     Palpations: Abdomen is soft.  Genitourinary:    Comments: Patient declined exam. Musculoskeletal:        General: Normal range of motion.     Cervical back: Normal range of motion and neck supple.  Skin:    General: Skin is warm and dry.     Capillary Refill: Capillary refill takes less than 2 seconds.  Neurological:     General: No focal deficit present.     Mental Status: He is alert and oriented to person, place, and time.  Psychiatric:        Mood and Affect: Mood normal.        Behavior: Behavior normal.    ASSESSMENT AND PLAN: 1. Annual physical exam: - Counseled on 150 minutes of exercise per week as tolerated, healthy eating (including decreased daily intake of saturated fats, cholesterol, added sugars, sodium), STI prevention, and routine healthcare maintenance.  2. Screening for metabolic disorder: - BMP last obtained 07/21/2020. - Hepatic Function Panel to screen liver function. - Hepatic Function Panel  3. Screening for deficiency anemia: - CBC last obtained 07/21/2020.  4. Diabetes mellitus screening: - Hemoglobin A1c to screen for pre-diabetes/diabetes. - Hemoglobin A1c  5. Screening cholesterol level: - Lipid panel to screen for high cholesterol.  - Lipid panel  6. Thyroid disorder screen: - TSH to check thyroid function.  - TSH  7. Need for hepatitis C screening test: - Hepatitis C antibody to screen for hepatitis C.  - Hepatitis C Antibody  8. Encounter for screening for HIV: - HIV antibody to screen for human immunodeficiency virus.  - HIV antibody (with  reflex)  9. Screening for STD (sexually transmitted disease): - Urine cytology to screen for gonorrhea, chlamydia, and trichomonas.  - Urine cytology ancillary only  10. HSV-2 infection: - Continue Valacyclovir as prescribed. - Follow-up with primary provider as scheduled.  - valACYclovir (VALTREX) 500 MG tablet; Take 1 tablet (500 mg total) by mouth daily. as directed  Dispense: 60 tablet; Refill: 0  11. Need for  HPV vaccination: - Administered today in office. - HPV 9-valent vaccine,Recombinat  12. Need for Tdap vaccination: - Administered today in office.  - Tdap vaccine greater than or equal to 7yo IM    Patient was given the opportunity to ask questions.  Patient verbalized understanding of the plan and was able to repeat key elements of the plan. Patient was given clear instructions to go to Emergency Department or return to medical center if symptoms don't improve, worsen, or new problems develop.The patient verbalized understanding.   Orders Placed This Encounter  Procedures  . HPV 9-valent vaccine,Recombinat  . Tdap vaccine greater than or equal to 7yo IM  . Hepatitis C Antibody  . HIV antibody (with reflex)  . Hepatic Function Panel  . Lipid panel  . TSH  . Hemoglobin A1c     Requested Prescriptions   Signed Prescriptions Disp Refills  . valACYclovir (VALTREX) 500 MG tablet 60 tablet 0    Sig: Take 1 tablet (500 mg total) by mouth daily. as directed    Follow-up with primary provider as scheduled.   Rema Fendt, NP

## 2020-10-17 ENCOUNTER — Other Ambulatory Visit: Payer: Self-pay

## 2020-10-17 ENCOUNTER — Ambulatory Visit (INDEPENDENT_AMBULATORY_CARE_PROVIDER_SITE_OTHER): Payer: Self-pay | Admitting: Family

## 2020-10-17 ENCOUNTER — Other Ambulatory Visit: Payer: Self-pay | Admitting: Family Medicine

## 2020-10-17 ENCOUNTER — Other Ambulatory Visit (HOSPITAL_COMMUNITY)
Admission: RE | Admit: 2020-10-17 | Discharge: 2020-10-17 | Disposition: A | Discharge status: Home / Self Care | Payer: Medicaid Other | Source: Ambulatory Visit | Attending: Family | Admitting: Family

## 2020-10-17 VITALS — BP 132/91 | HR 81 | Temp 98.4°F | Resp 15 | Ht 68.94 in | Wt 143.8 lb

## 2020-10-17 DIAGNOSIS — Z0000 Encounter for general adult medical examination without abnormal findings: Secondary | ICD-10-CM

## 2020-10-17 DIAGNOSIS — B009 Herpesviral infection, unspecified: Secondary | ICD-10-CM

## 2020-10-17 DIAGNOSIS — Z1329 Encounter for screening for other suspected endocrine disorder: Secondary | ICD-10-CM

## 2020-10-17 DIAGNOSIS — Z1159 Encounter for screening for other viral diseases: Secondary | ICD-10-CM

## 2020-10-17 DIAGNOSIS — Z130 Encounter for screening for diseases of the blood and blood-forming organs and certain disorders involving the immune mechanism: Secondary | ICD-10-CM

## 2020-10-17 DIAGNOSIS — Z113 Encounter for screening for infections with a predominantly sexual mode of transmission: Secondary | ICD-10-CM | POA: Diagnosis present

## 2020-10-17 DIAGNOSIS — Z13220 Encounter for screening for lipoid disorders: Secondary | ICD-10-CM

## 2020-10-17 DIAGNOSIS — Z114 Encounter for screening for human immunodeficiency virus [HIV]: Secondary | ICD-10-CM

## 2020-10-17 DIAGNOSIS — Z131 Encounter for screening for diabetes mellitus: Secondary | ICD-10-CM

## 2020-10-17 DIAGNOSIS — Z13228 Encounter for screening for other metabolic disorders: Secondary | ICD-10-CM

## 2020-10-17 DIAGNOSIS — Z23 Encounter for immunization: Secondary | ICD-10-CM

## 2020-10-17 MED ORDER — VALACYCLOVIR HCL 500 MG PO TABS: 500.0000 mg | ORAL_TABLET | Freq: Every day | ORAL | 0 refills | Status: AC

## 2020-10-17 NOTE — Progress Notes (Signed)
Pt presents today for annual physical exam, pt desires STD screening urine  Pt request refill on Valtrex sent to [facility] on [address]  Pt desires HPV and Tetanus vaccine

## 2020-10-18 LAB — LIPID PANEL
Chol/HDL Ratio: 2.5 ratio (ref 0.0–5.0)
Cholesterol, Total: 165 mg/dL (ref 100–199)
HDL: 67 mg/dL (ref >39)
LDL Chol Calc (NIH): 89 mg/dL (ref 0–99)
Triglycerides: 42 mg/dL (ref 0–149)
VLDL Cholesterol Cal: 9 mg/dL (ref 5–40)

## 2020-10-18 LAB — HEPATIC FUNCTION PANEL
ALT: 15 IU/L (ref 0–44)
AST: 12 IU/L (ref 0–40)
Albumin: 4.5 g/dL (ref 4.1–5.2)
Alkaline Phosphatase: 94 IU/L (ref 44–121)
Bilirubin Total: 0.3 mg/dL (ref 0.0–1.2)
Bilirubin, Direct: <0.1 mg/dL (ref 0.00–0.40)
Total Protein: 6.6 g/dL (ref 6.0–8.5)

## 2020-10-18 LAB — TSH: TSH: 2.16 u[IU]/mL (ref 0.450–4.500)

## 2020-10-18 LAB — HEMOGLOBIN A1C
Est. average glucose Bld gHb Est-mCnc: 117 mg/dL
Hgb A1c MFr Bld: 5.7 % — ABNORMAL HIGH (ref 4.8–5.6)

## 2020-10-18 LAB — HIV ANTIBODY (ROUTINE TESTING W REFLEX): HIV Screen 4th Generation wRfx: NONREACTIVE

## 2020-10-18 LAB — HEPATITIS C ANTIBODY: Hep C Virus Ab: 0.2 s/co ratio (ref 0.0–0.9)

## 2020-10-18 NOTE — Progress Notes (Signed)
Liver function normal.   Thyroid function normal.   Cholesterol normal.  Hepatitis C negative.   HIV negative.   Hemoglobin A1c is consistent with pre-diabetes. Practice healthy eating habits of fresh fruit and vegetables, lean baked meats such as chicken, fish, and [place]; limit breads, rice, pastas, and desserts; practice regular aerobic exercise (at least 150 minutes a week as tolerated). No medication needed at the moment. Encouraged to recheck lab in 6 months.

## 2020-10-22 LAB — URINE CYTOLOGY ANCILLARY ONLY
Bacterial Vaginitis-Urine: NEGATIVE
Candida Urine: NEGATIVE
Chlamydia: NEGATIVE
Comment: NEGATIVE
Comment: NEGATIVE
Comment: NORMAL
Neisseria Gonorrhea: NEGATIVE
Trichomonas: NEGATIVE

## 2020-10-22 NOTE — Progress Notes (Signed)
Gonorrhea, Chlamydia, and Trichomonas negative.

## 2020-10-26 ENCOUNTER — Other Ambulatory Visit: Payer: Self-pay

## 2020-11-02 ENCOUNTER — Other Ambulatory Visit: Payer: Self-pay | Admitting: Family Medicine

## 2020-11-02 DIAGNOSIS — J4540 Moderate persistent asthma, uncomplicated: Secondary | ICD-10-CM

## 2020-11-05 ENCOUNTER — Encounter: Payer: Self-pay | Admitting: Family

## 2020-11-07 ENCOUNTER — Other Ambulatory Visit: Payer: Self-pay

## 2020-11-09 ENCOUNTER — Ambulatory Visit: Payer: Medicaid Other | Admitting: Internal Medicine

## 2020-12-24 ENCOUNTER — Telehealth: Payer: Self-pay

## 2020-12-24 NOTE — Telephone Encounter (Signed)
Spoke with pt, informed him he has not seen Dr Salomon Fick since 11/2018 when the medicine was prescribed. Advised he would have to ask his PCP for a refill on medication.

## 2020-12-24 NOTE — Telephone Encounter (Signed)
Patient called requesting a refill on :  valACYclovir (VALTREX) 500 MG tablet I informed patient he should call PCP who prescribed it. Patient would like a call back to discuss.

## 2020-12-25 ENCOUNTER — Encounter: Payer: Self-pay | Admitting: Nurse Practitioner

## 2020-12-25 ENCOUNTER — Ambulatory Visit (INDEPENDENT_AMBULATORY_CARE_PROVIDER_SITE_OTHER): Payer: Self-pay | Admitting: Nurse Practitioner

## 2020-12-25 ENCOUNTER — Other Ambulatory Visit: Payer: Self-pay

## 2020-12-25 DIAGNOSIS — B009 Herpesviral infection, unspecified: Secondary | ICD-10-CM

## 2020-12-25 MED ORDER — VALACYCLOVIR HCL 500 MG PO TABS: 500.0000 mg | ORAL_TABLET | Freq: Every day | ORAL | 0 refills | Status: AC

## 2020-12-25 NOTE — Progress Notes (Signed)
Virtual Visit via Telephone Note  I connected with Jonathon Williamson on 12/25/20 at  2:45 PM EDT by telephone and verified that I am speaking with the correct person using two identifiers.  Location: Patient: home Provider: office   I discussed the limitations, risks, security and privacy concerns of performing an evaluation and management service by telephone and the availability of in person appointments. I also discussed with the patient that there may be a patient responsible charge related to this service. The patient expressed understanding and agreed to proceed.   History of Present Illness:  Patient presents today for a medication refill through televisit.  Patient states that he needs refill on Valtrex.  He is on this daily for prophylaxis.  He has ran out of his current prescription.  He does not currently have an outbreak.  We discussed that we will refill this medication for him. Denies f/c/s, n/v/d, hemoptysis, PND, chest pain or edema     Observations/Objective:  Vitals with BMI 10/17/2020 09/05/2020 07/22/2020  Height 5' 8.937" 5' 8.937" -  Weight 143 lbs 13 oz 143 lbs -  BMI 21.27 21.16 -  Systolic 132 135 638  Diastolic 91 89 91  Pulse 81 60 114      Assessment and Plan:  Medication Refill:  Will reorder valtrex  Follow up:  Follow up as scheduled with PCP    I discussed the assessment and treatment plan with the patient. The patient was provided an opportunity to ask questions and all were answered. The patient agreed with the plan and demonstrated an understanding of the instructions.   The patient was advised to call back or seek an in-person evaluation if the symptoms worsen or if the condition fails to improve as anticipated.  I provided 23 minutes of non-face-to-face time during this encounter.   Ivonne Andrew, NP

## 2020-12-25 NOTE — Progress Notes (Signed)
Pt presents for telemedicine visit for Valacyclovir 500 mg refill

## 2020-12-25 NOTE — Patient Instructions (Signed)
Medication Refill:  Will reorder valtrex  Follow up:  Follow up as scheduled with PCP

## 2020-12-31 ENCOUNTER — Other Ambulatory Visit: Payer: Self-pay

## 2020-12-31 ENCOUNTER — Other Ambulatory Visit: Payer: Self-pay | Admitting: Family

## 2020-12-31 DIAGNOSIS — J4540 Moderate persistent asthma, uncomplicated: Secondary | ICD-10-CM

## 2020-12-31 MED ORDER — ALBUTEROL SULFATE HFA 108 (90 BASE) MCG/ACT IN AERS
2.0000 | INHALATION_SPRAY | Freq: Four times a day (QID) | RESPIRATORY_TRACT | 1 refills | Status: DC | PRN
  Filled 2020-12-31: qty 18, 25d supply, fill #0
  Filled 2021-01-23: qty 18, 25d supply, fill #1

## 2021-01-01 ENCOUNTER — Other Ambulatory Visit: Payer: Self-pay

## 2021-01-23 ENCOUNTER — Other Ambulatory Visit: Payer: Self-pay

## 2021-02-04 ENCOUNTER — Other Ambulatory Visit: Payer: Self-pay | Admitting: Nurse Practitioner

## 2021-02-05 ENCOUNTER — Telehealth: Payer: Self-pay | Admitting: Family

## 2021-02-05 NOTE — Telephone Encounter (Signed)
Med refill request valACYclovir (VALTREX) 500 MG tablet [[id number]]  ENDED  Pharmacy  [facility] [id number] - [place], [place] - [address]  [address] [place] [address]  Phone:  [phone]  Fax:  [phone]

## 2021-02-07 ENCOUNTER — Other Ambulatory Visit: Payer: Self-pay | Admitting: Nurse Practitioner

## 2021-02-07 MED ORDER — VALACYCLOVIR HCL 500 MG PO TABS
500.0000 mg | ORAL_TABLET | Freq: Two times a day (BID) | ORAL | 2 refills | Status: DC
Start: 2021-02-07 — End: 2021-04-29

## 2021-02-12 ENCOUNTER — Other Ambulatory Visit: Payer: Self-pay | Admitting: Family

## 2021-02-12 DIAGNOSIS — J4540 Moderate persistent asthma, uncomplicated: Secondary | ICD-10-CM

## 2021-02-13 ENCOUNTER — Other Ambulatory Visit: Payer: Self-pay

## 2021-02-14 ENCOUNTER — Other Ambulatory Visit: Payer: Self-pay

## 2021-02-14 MED ORDER — ALBUTEROL SULFATE HFA 108 (90 BASE) MCG/ACT IN AERS
2.0000 | INHALATION_SPRAY | Freq: Four times a day (QID) | RESPIRATORY_TRACT | 1 refills | Status: DC | PRN
Start: 2021-02-14 — End: 2021-04-29
  Filled 2021-02-14: qty 18, 25d supply, fill #0
  Filled 2021-04-01: qty 18, 25d supply, fill #1

## 2021-02-20 ENCOUNTER — Other Ambulatory Visit: Payer: Self-pay

## 2021-04-01 ENCOUNTER — Other Ambulatory Visit: Payer: Self-pay

## 2021-04-23 NOTE — Progress Notes (Signed)
Patient ID: Jonathon Williamson, male    DOB: Aug 03, 1995  MRN: [id number]  CC: Asthma Follow-Up   Subjective: Jonathon Williamson is a 26 y.o. male who presents for asthma follow-up.   His concerns today include:  Requesting refills on Albuterol, Fluticasone, and Valacyclovir. Doing well on current regimen. No issues/concerns.   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     No current facility-administered medications on file prior to visit.    Allergies  Allergen Reactions   Peanut Oil Itching   Penicillins Hives    Social History   Socioeconomic History   Marital status: Single    Spouse name: Not on file   Number of children: Not on file   Years of education: Not on file   Highest education level: Not on file  Occupational History   Not on file  Tobacco Use   Smoking status: Never   Smokeless tobacco: Never  Vaping Use   Vaping Use: Never used  Substance and Sexual Activity   Alcohol use: Yes    Alcohol/week: 2.0 standard drinks    Types: 2 Standard drinks or equivalent per week   Drug use: Yes    Types: Marijuana   Sexual activity: Not on file  Other Topics Concern   Not on file  Social History Narrative   Not on file   Social Determinants of Health   Financial Resource Strain: Not on file  Food Insecurity: Not on file  Transportation Needs: Not on file  Physical Activity: Not on file  Stress: Not on file  Social Connections: Not on file  Intimate Partner Violence: Not on file    Family History  Problem Relation Age of  Onset   Diabetes Father     Past Surgical History:  Procedure Laterality Date   NO PAST SURGERIES      ROS: Review of Systems Negative except as stated above  PHYSICAL EXAM: BP 129/89 (BP Location: Left Arm, Patient Position: Sitting, Cuff Size: Normal)    Pulse (!) 59    Temp 98.3 F (36.8 C)    Resp 16    Ht 5' 8.94" (1.751 m)    SpO2 98%    BMI 21.27 kg/m   Physical Exam HENT:     Head: Normocephalic and atraumatic.  Eyes:     Extraocular Movements: Extraocular movements intact.     Conjunctiva/sclera: Conjunctivae normal.     Pupils: Pupils are equal, round, and reactive to light.  Cardiovascular:     Rate and Rhythm: Normal rate and regular rhythm.     Pulses: Normal pulses.     Heart sounds: Normal heart sounds.  Pulmonary:     Effort: Pulmonary effort is normal.     Breath sounds: Normal breath sounds.  Musculoskeletal:     Cervical back: Normal range of motion and neck supple.  Neurological:     General: No focal deficit present.     Mental Status: He is alert and oriented to person, place, and time.  Psychiatric:        Mood and  Affect: Mood normal.        Behavior: Behavior normal.   ASSESSMENT AND PLAN: 1. Moderate persistent asthma without complication: - Continue Albuterol inhaler and Fluticasone inhaler as prescribed.  - Follow-up with primary provider as scheduled.  - albuterol (VENTOLIN HFA) 108 (90 Base) MCG/ACT inhaler; Inhale 2 puffs into the lungs every 6 (six) hours as needed for wheezing or shortness of breath.  Dispense: 18 g; Refill: 1 - fluticasone (FLOVENT HFA) 110 MCG/ACT inhaler; Inhale 2 puffs into the lungs daily.  Dispense: 12 g; Refill: 2  2. HSV-2 infection: - Continue Valacyclovir as prescribed.  - Follow-up with primary provider as scheduled. - valACYclovir (VALTREX) 500 MG tablet; Take 1 tablet (500 mg total) by mouth 2 (two) times daily.  Dispense: 60 tablet; Refill: 2    Patient was given the opportunity to ask questions.   Patient verbalized understanding of the plan and was able to repeat key elements of the plan. Patient was given clear instructions to go to Emergency Department or return to medical center if symptoms don't improve, worsen, or new problems develop.The patient verbalized understanding.   Requested Prescriptions   Signed Prescriptions Disp Refills   valACYclovir (VALTREX) 500 MG tablet 60 tablet 2    Sig: Take 1 tablet (500 mg total) by mouth 2 (two) times daily.   albuterol (VENTOLIN HFA) 108 (90 Base) MCG/ACT inhaler 18 g 1    Sig: Inhale 2 puffs into the lungs every 6 (six) hours as needed for wheezing or shortness of breath.   fluticasone (FLOVENT HFA) 110 MCG/ACT inhaler 12 g 2    Sig: Inhale 2 puffs into the lungs daily.    Follow-up with primary provider as scheduled.  Rema Fendt, NP

## 2021-04-29 ENCOUNTER — Ambulatory Visit (INDEPENDENT_AMBULATORY_CARE_PROVIDER_SITE_OTHER): Payer: Managed Care, Other (non HMO) | Admitting: Family

## 2021-04-29 ENCOUNTER — Other Ambulatory Visit: Payer: Self-pay

## 2021-04-29 ENCOUNTER — Encounter: Payer: Self-pay | Admitting: Family

## 2021-04-29 VITALS — BP 129/89 | HR 59 | Temp 98.3°F | Resp 16 | Ht 68.94 in | Wt 143.0 lb

## 2021-04-29 DIAGNOSIS — J4540 Moderate persistent asthma, uncomplicated: Secondary | ICD-10-CM | POA: Diagnosis not present

## 2021-04-29 DIAGNOSIS — B009 Herpesviral infection, unspecified: Secondary | ICD-10-CM

## 2021-04-29 MED ORDER — ALBUTEROL SULFATE HFA 108 (90 BASE) MCG/ACT IN AERS: 2.0000 | INHALATION_SPRAY | Freq: Four times a day (QID) | RESPIRATORY_TRACT | 1 refills | Status: DC | PRN

## 2021-04-29 MED ORDER — VALACYCLOVIR HCL 500 MG PO TABS
500.0000 mg | ORAL_TABLET | Freq: Two times a day (BID) | ORAL | 2 refills | Status: AC
  Filled 2021-04-29: qty 60, 30d supply, fill #0

## 2021-04-29 MED ORDER — VALACYCLOVIR HCL 500 MG PO TABS: 500.0000 mg | ORAL_TABLET | Freq: Two times a day (BID) | ORAL | 2 refills | Status: DC

## 2021-04-29 MED ORDER — FLUTICASONE PROPIONATE HFA 110 MCG/ACT IN AERO
2.0000 | INHALATION_SPRAY | Freq: Every day | RESPIRATORY_TRACT | 2 refills | Status: DC
  Filled 2021-04-29: qty 12, 30d supply, fill #0

## 2021-04-29 MED ORDER — FLUTICASONE PROPIONATE HFA 110 MCG/ACT IN AERO: 2.0000 | INHALATION_SPRAY | Freq: Every day | RESPIRATORY_TRACT | 2 refills | Status: DC

## 2021-04-29 MED ORDER — ALBUTEROL SULFATE HFA 108 (90 BASE) MCG/ACT IN AERS
2.0000 | INHALATION_SPRAY | Freq: Four times a day (QID) | RESPIRATORY_TRACT | 1 refills | Status: DC | PRN
  Filled 2021-04-29 – 2021-05-10 (×2): qty 8.5, 25d supply, fill #0

## 2021-04-29 NOTE — Progress Notes (Signed)
Pt present for medication refill for valacyclovir sent to [facility] and albuterol inhaler sent to [facility]

## 2021-04-30 ENCOUNTER — Other Ambulatory Visit: Payer: Self-pay

## 2021-05-06 ENCOUNTER — Other Ambulatory Visit: Payer: Self-pay

## 2021-05-09 ENCOUNTER — Other Ambulatory Visit: Payer: Self-pay

## 2021-05-09 MED ORDER — ALBUTEROL SULFATE HFA 108 (90 BASE) MCG/ACT IN AERS
2.0000 | INHALATION_SPRAY | Freq: Four times a day (QID) | RESPIRATORY_TRACT | 1 refills | Status: DC | PRN
  Filled 2021-05-09: qty 8.5, 25d supply, fill #0

## 2021-05-10 ENCOUNTER — Other Ambulatory Visit: Payer: Self-pay

## 2021-05-10 ENCOUNTER — Other Ambulatory Visit: Payer: Self-pay | Admitting: Family

## 2021-05-10 ENCOUNTER — Encounter: Payer: Self-pay | Admitting: Family

## 2021-05-10 DIAGNOSIS — J4540 Moderate persistent asthma, uncomplicated: Secondary | ICD-10-CM

## 2021-05-10 MED ORDER — FLUTICASONE PROPIONATE HFA 44 MCG/ACT IN AERO
2.0000 | INHALATION_SPRAY | Freq: Every day | RESPIRATORY_TRACT | 1 refills | Status: AC
  Filled 2021-05-10: qty 10.6, 30d supply, fill #0

## 2021-05-10 MED ORDER — ALBUTEROL SULFATE HFA 108 (90 BASE) MCG/ACT IN AERS
2.0000 | INHALATION_SPRAY | Freq: Four times a day (QID) | RESPIRATORY_TRACT | 1 refills | Status: DC | PRN
  Filled 2021-05-10: qty 12.85, fill #0
  Filled 2021-05-31: qty 18, 25d supply, fill #0

## 2021-05-13 ENCOUNTER — Other Ambulatory Visit: Payer: Self-pay

## 2021-05-16 ENCOUNTER — Other Ambulatory Visit: Payer: Self-pay

## 2021-05-31 ENCOUNTER — Other Ambulatory Visit: Payer: Self-pay | Admitting: Family

## 2021-05-31 ENCOUNTER — Other Ambulatory Visit: Payer: Self-pay

## 2021-05-31 DIAGNOSIS — J4540 Moderate persistent asthma, uncomplicated: Secondary | ICD-10-CM

## 2021-05-31 MED ORDER — ALBUTEROL SULFATE HFA 108 (90 BASE) MCG/ACT IN AERS
INHALATION_SPRAY | RESPIRATORY_TRACT | 0 refills | Status: DC
  Filled 2021-05-31: qty 8.5, 25d supply, fill #0

## 2021-06-03 ENCOUNTER — Other Ambulatory Visit: Payer: Self-pay

## 2021-06-03 MED ORDER — ALBUTEROL SULFATE HFA 108 (90 BASE) MCG/ACT IN AERS
2.0000 | INHALATION_SPRAY | Freq: Four times a day (QID) | RESPIRATORY_TRACT | 1 refills | Status: DC | PRN
  Filled 2021-06-03: qty 8.5, 25d supply, fill #0
  Filled 2021-06-27 – 2021-09-06 (×2): qty 8.5, 25d supply, fill #1

## 2021-06-27 ENCOUNTER — Other Ambulatory Visit: Payer: Self-pay

## 2021-07-04 ENCOUNTER — Other Ambulatory Visit: Payer: Self-pay

## 2021-07-07 ENCOUNTER — Emergency Department (HOSPITAL_BASED_OUTPATIENT_CLINIC_OR_DEPARTMENT_OTHER): Payer: Managed Care, Other (non HMO)

## 2021-07-07 ENCOUNTER — Other Ambulatory Visit: Payer: Self-pay

## 2021-07-07 ENCOUNTER — Encounter (HOSPITAL_BASED_OUTPATIENT_CLINIC_OR_DEPARTMENT_OTHER): Payer: Self-pay | Admitting: Emergency Medicine

## 2021-07-07 ENCOUNTER — Emergency Department (HOSPITAL_BASED_OUTPATIENT_CLINIC_OR_DEPARTMENT_OTHER)
Admission: EM | Admit: 2021-07-07 | Discharge: 2021-07-07 | Disposition: A | Discharge status: Home / Self Care | Payer: Managed Care, Other (non HMO) | Attending: Emergency Medicine | Admitting: Emergency Medicine

## 2021-07-07 DIAGNOSIS — Z91010 Allergy to peanuts: Secondary | ICD-10-CM | POA: Diagnosis not present

## 2021-07-07 DIAGNOSIS — J45901 Unspecified asthma with (acute) exacerbation: Secondary | ICD-10-CM | POA: Insufficient documentation

## 2021-07-07 DIAGNOSIS — Z7951 Long term (current) use of inhaled steroids: Secondary | ICD-10-CM | POA: Diagnosis not present

## 2021-07-07 DIAGNOSIS — R0602 Shortness of breath: Secondary | ICD-10-CM | POA: Diagnosis present

## 2021-07-07 MED ORDER — IPRATROPIUM-ALBUTEROL 0.5-2.5 (3) MG/3ML IN SOLN: 3.0000 mL | RESPIRATORY_TRACT | Status: DC

## 2021-07-07 MED ORDER — IPRATROPIUM-ALBUTEROL 0.5-2.5 (3) MG/3ML IN SOLN
RESPIRATORY_TRACT | Status: AC
  Filled 2021-07-07: qty 3

## 2021-07-07 MED ORDER — ALBUTEROL SULFATE (2.5 MG/3ML) 0.083% IN NEBU
2.5000 mg | INHALATION_SOLUTION | RESPIRATORY_TRACT | Status: AC
  Administered 2021-07-07: 2.5 mg via RESPIRATORY_TRACT
  Filled 2021-07-07: qty 3

## 2021-07-07 MED ORDER — PREDNISONE 20 MG PO TABS
40.0000 mg | ORAL_TABLET | Freq: Every day | ORAL | 0 refills | Status: AC
Start: 2021-07-07 — End: 2021-07-12

## 2021-07-07 MED ORDER — DEXAMETHASONE SODIUM PHOSPHATE 10 MG/ML IJ SOLN
10.0000 mg | Freq: Once | INTRAMUSCULAR | Status: AC
  Administered 2021-07-07: 10 mg via INTRAMUSCULAR
  Filled 2021-07-07: qty 1

## 2021-07-07 NOTE — ED Notes (Signed)
Patient discharged to home.  All discharge instructions reviewed.  Patient verbalized understanding via teachback method.  VS WDL.  Respirations even and unlabored.  Ambulatory out of ED.   °

## 2021-07-07 NOTE — Discharge Instructions (Signed)
Follow-up with your primary care doctor.  Take course of steroids.  Take your albuterol inhaler at home as needed. ?

## 2021-07-07 NOTE — ED Provider Notes (Signed)
?[facility] EMERGENCY DEPARTMENT ?Provider Note ? ? ?CSN: [id number] ?Arrival date & time: 07/07/21  1935 ? ?  ? ?History ? ?Chief Complaint  ?Patient presents with  ? Shortness of Breath  ? ? ?Jonathon Williamson is a 26 y.o. male.  Presenting to ER with concern for asthma exacerbation.  Patient reports that he was in house that he thinks may have triggered his asthma .  Has been having increased difficulty breathing, tightness in his chest ever since then.  Just denies any pain in his chest.  No cough, no congestion, no fever. ? ?Has history of asthma, states feels similar to prior. ? ?HPI ? ?  ? ?Home Medications ?Prior to Admission medications   ?Medication Sig Start Date End Date Taking? Authorizing Provider  ?predniSONE (DELTASONE) 20 MG tablet Take 2 tablets (40 mg total) by mouth daily for 5 days. 07/07/21 07/12/21 Yes Treylan Mcclintock, Quitman Livings, MD  ?albuterol (VENTOLIN HFA) 108 (90 Base) MCG/ACT inhaler Inhale 2 puffs into the lungs every 6 (six) hours as needed for wheezing or shortness of breath. 06/03/21   Rema Fendt, NP  ?fluticasone (FLOVENT HFA) 44 MCG/ACT inhaler Inhale 2 puffs into the lungs daily. 05/10/21   Rema Fendt, NP  ?ibuprofen (ADVIL,MOTRIN) 800 MG tablet Take 800 mg every 8 (eight) hours as needed by mouth.    [provider]  ?valACYclovir (VALTREX) 500 MG tablet Take 1 tablet (500 mg total) by mouth 2 (two) times daily. 04/29/21 07/28/21  Rema Fendt, NP  ?   ? ?Allergies    ?Peanut oil, Penicillins, Atrovent hfa [ipratropium bromide hfa], and Peanut (diagnostic)   ? ?Review of Systems   ?Review of Systems  ?Constitutional:  Negative for chills and fever.  ?HENT:  Negative for ear pain and sore throat.   ?Eyes:  Negative for pain and visual disturbance.  ?Respiratory:  Positive for chest tightness and shortness of breath. Negative for cough.   ?Cardiovascular:  Negative for chest pain and palpitations.  ?Gastrointestinal:  Negative for abdominal pain and vomiting.   ?Genitourinary:  Negative for dysuria and hematuria.  ?Musculoskeletal:  Negative for arthralgias and back pain.  ?Skin:  Negative for color change and rash.  ?Neurological:  Negative for seizures and syncope.  ?All other systems reviewed and are negative. ? ?Physical Exam ?Updated Vital Signs ?BP (!) 147/96   Pulse 83   Temp 98.1 ?F (36.7 ?C) (Oral)   Resp 18   Ht 5\' 8"  (1.727 m)   Wt 64.9 kg   SpO2 99%   BMI 21.76 kg/m?  ?Physical Exam ?Vitals and nursing note reviewed.  ?Constitutional:   ?   General: He is not in acute distress. ?   Appearance: He is well-developed.  ?HENT:  ?   Head: Normocephalic and atraumatic.  ?Eyes:  ?   Conjunctiva/sclera: Conjunctivae normal.  ?Cardiovascular:  ?   Rate and Rhythm: Normal rate and regular rhythm.  ?   Heart sounds: No murmur heard. ?Pulmonary:  ?   Effort: Pulmonary effort is normal.  ?   Comments: Effort is normal, speaks in full sentences but did note bilateral expiratory wheezing ?Abdominal:  ?   Palpations: Abdomen is soft.  ?   Tenderness: There is no abdominal tenderness.  ?Musculoskeletal:     ?   General: No swelling.  ?   Cervical back: Neck supple.  ?Skin: ?   General: Skin is warm and dry.  ?   Capillary Refill: Capillary refill takes  less than 2 seconds.  ?Neurological:  ?   Mental Status: He is alert.  ?Psychiatric:     ?   Mood and Affect: Mood normal.  ? ? ?ED Results / Procedures / Treatments   ?Labs ?(all labs ordered are listed, but only abnormal results are displayed) ?Labs Reviewed - No data to display ? ?EKG ?None ? ?Radiology ?DG Chest 2 View ? ?Result Date: 07/07/2021 ?CLINICAL DATA:  Asthma exacerbation. EXAM: CHEST - 2 VIEW COMPARISON:  July 21, 2020 FINDINGS: The heart size and mediastinal contours are within normal limits. Both lungs are clear. The visualized skeletal structures are unremarkable. IMPRESSION: No active cardiopulmonary disease. Electronically Signed   By: Aram Candela M.D.   On: 07/07/2021 20:36    ? ?Procedures ?Procedures  ? ? ?Medications Ordered in ED ?Medications  ?albuterol (PROVENTIL) (2.5 MG/3ML) 0.083% nebulizer solution 2.5 mg (2.5 mg Nebulization Given 07/07/21 1951)  ?dexamethasone (DECADRON) injection 10 mg (10 mg Intramuscular Given 07/07/21 2105)  ?albuterol (PROVENTIL) (2.5 MG/3ML) 0.083% nebulizer solution 2.5 mg (2.5 mg Nebulization Given 07/07/21 2101)  ? ? ?ED Course/ Medical Decision Making/ A&P ?  ?                        ?Medical Decision Making ?Amount and/or Complexity of Data Reviewed ?Radiology: ordered. ? ?Risk ?Prescription drug management. ? ? ?26 year old presenting to ER with concern for asthma flare.  On exam noted to have bilateral expiratory wheeze the patient was not in any distress.  Provided neb treatment, check CXR.  No evidence for pneumonia.  I independently reviewed CXR.  On reassessment wheezing improved but still having some ongoing wheezing, gave dose of Decadron and additional neb treatment.  On final reassessment, the wheezing had near completely resolved and patient was feeling much better.  His vital signs were all normal.  Originally due to need for multiple neb treatments had considered potential need for admission however given his overall well appearance and marked improvement and normal vital signs, feel he is stable for discharge and management in the outpatient setting at this time.  Gave Rx for prednisone short course, advised follow-up with PCP. ? ? ? ?After the discussed management above, the patient was determined to be safe for discharge.  The patient was in agreement with this plan and all questions regarding their care were answered.  ED return precautions were discussed and the patient will return to the ED with any significant worsening of condition. ? ? ? ? ? ? ? ? ?Final Clinical Impression(s) / ED Diagnoses ?Final diagnoses:  ?Exacerbation of asthma, unspecified asthma severity, unspecified whether persistent  ? ? ?Rx / DC Orders ?ED Discharge  Orders   ? ?      Ordered  ?  predniSONE (DELTASONE) 20 MG tablet  Daily       ? 07/07/21 2240  ? ?  ?  ? ?  ? ? ?  ?Milagros Loll, MD ?07/09/21 [phone] ? ?

## 2021-07-07 NOTE — ED Notes (Signed)
Patient requesting additional nebulizer.  Dr. Stevie Kern notified verbally.  ?

## 2021-07-07 NOTE — ED Triage Notes (Addendum)
PT has hx of asthma and was hospitalized when very young once. Takes Flovent daily and albuterol rescue. Was at family member's dusty home today that he thinks triggered it. Has used his rescue inhaler multiple times today. No signs of respiratory distress or SOB upon exertion or at rest.  Sats 100% on RA. Afebrile.  ?

## 2021-09-06 ENCOUNTER — Other Ambulatory Visit: Payer: Self-pay

## 2021-10-03 ENCOUNTER — Other Ambulatory Visit: Payer: Self-pay

## 2021-10-03 ENCOUNTER — Other Ambulatory Visit: Payer: Self-pay | Admitting: Family

## 2021-10-03 DIAGNOSIS — J4540 Moderate persistent asthma, uncomplicated: Secondary | ICD-10-CM

## 2021-10-03 MED ORDER — ALBUTEROL SULFATE HFA 108 (90 BASE) MCG/ACT IN AERS
2.0000 | INHALATION_SPRAY | Freq: Four times a day (QID) | RESPIRATORY_TRACT | 1 refills | Status: AC | PRN
  Filled 2021-10-03: qty 18, 25d supply, fill #0
  Filled 2021-11-15: qty 18, 25d supply, fill #1

## 2021-10-08 NOTE — Progress Notes (Deleted)
Patient ID: Jonathon Williamson, male    DOB: [birth year]  MRN: [id number]  CC: Annual Physical Exam  Subjective: Jonathon Williamson is a 26 y.o. male who presents for annual physical exam.   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 18 g 1   fluticasone (FLOVENT HFA) 44 MCG/ACT inhaler Inhale 2 puffs into the lungs daily. 10.6 g 1   ibuprofen (ADVIL,MOTRIN) 800 MG tablet Take 800 mg every 8 (eight) hours as needed by mouth.     No current facility-administered medications on file prior to visit.    Allergies  Allergen Reactions   Peanut Oil Itching   Penicillins Hives   Atrovent Hfa [Ipratropium Bromide Hfa]    Peanut (Diagnostic)     Social History   Socioeconomic History   Marital status: Single    Spouse name: Not on file   Number of children: Not on file   Years of education: Not on file   Highest education level: Not on file  Occupational History   Not on file  Tobacco Use   Smoking status: Never   Smokeless tobacco: Never  Vaping Use   Vaping Use: Never used  Substance and Sexual Activity   Alcohol use: Yes    Alcohol/week: 2.0 standard drinks of alcohol    Types: 2 Standard drinks or equivalent per week   Drug use: Yes    Types: Marijuana   Sexual activity: Not on file  Other Topics Concern   Not on file  Social History Narrative   Not on file   Social Determinants of Health   Financial Resource Strain: Not on file  Food Insecurity:  Not on file  Transportation Needs: Not on file  Physical Activity: Not on file  Stress: Not on file  Social Connections: Not on file  Intimate Partner Violence: Not on file    Family History  Problem Relation Age of Onset   Diabetes Father     Past Surgical History:  Procedure Laterality Date   NO PAST SURGERIES      ROS: Review of Systems Negative except as stated above  PHYSICAL EXAM: There were no vitals taken for this visit.  Physical Exam  {male adult master:310786} {male adult master:310785}     Latest Ref Rng & Units 10/17/2020    3:22 PM 07/21/2020   10:57 PM 11/23/2016   12:07 AM  CMP  Glucose 70 - 99 mg/dL  98  98   BUN 6 - 20 mg/dL  15  14   Creatinine 3.53 - 1.24 mg/dL  2.99  2.42   Sodium 683 - 145 mmol/L  138  136   Potassium 3.5 - 5.1 mmol/L  3.6  3.6   Chloride 98 - 111 mmol/L  103  103   CO2 22 - 32 mmol/L  26  24   Calcium 8.9 - 10.3 mg/dL  8.7  9.3   Total Protein 6.0 - 8.5 g/dL 6.6   7.0   Total  Bilirubin 0.0 - 1.2 mg/dL 0.3   0.6   Alkaline Phos 44 - 121 IU/L 94   77   AST 0 - 40 IU/L 12   21   ALT 0 - 44 IU/L 15   13    Lipid Panel     Component Value Date/Time   CHOL 165 10/17/2020 1522   TRIG 42 10/17/2020 1522   HDL 67 10/17/2020 1522   CHOLHDL 2.5 10/17/2020 1522   LDLCALC 89 10/17/2020 1522    CBC    Component Value Date/Time   WBC 6.8 07/21/2020 2257   RBC 5.63 07/21/2020 2257   HGB 14.1 07/21/2020 2257   HCT 43.3 07/21/2020 2257   PLT 162 07/21/2020 2257   MCV 76.9 (L) 07/21/2020 2257   MCH 25.0 (L) 07/21/2020 2257   MCHC 32.6 07/21/2020 2257   RDW 13.4 07/21/2020 2257   LYMPHSABS 1.7 07/21/2020 2257   MONOABS 0.5 07/21/2020 2257   EOSABS 0.2 07/21/2020 2257   BASOSABS 0.0 07/21/2020 2257    ASSESSMENT AND PLAN:  There are no diagnoses linked to this encounter.   Patient was given the opportunity to ask questions.  Patient verbalized understanding of the plan and was able to repeat key elements of the plan.  Patient was given clear instructions to go to Emergency Department or return to medical center if symptoms don't improve, worsen, or new problems develop.The patient verbalized understanding.   No orders of the defined types were placed in this encounter.    Requested Prescriptions    No prescriptions requested or ordered in this encounter    No follow-ups on file.  Rema Fendt, NP

## 2021-10-18 ENCOUNTER — Encounter: Payer: 59 | Admitting: Family

## 2021-10-18 DIAGNOSIS — Z0000 Encounter for general adult medical examination without abnormal findings: Secondary | ICD-10-CM

## 2021-10-18 DIAGNOSIS — Z1329 Encounter for screening for other suspected endocrine disorder: Secondary | ICD-10-CM

## 2021-10-18 DIAGNOSIS — Z13220 Encounter for screening for lipoid disorders: Secondary | ICD-10-CM

## 2021-10-18 DIAGNOSIS — Z130 Encounter for screening for diseases of the blood and blood-forming organs and certain disorders involving the immune mechanism: Secondary | ICD-10-CM

## 2021-10-18 DIAGNOSIS — Z13228 Encounter for screening for other metabolic disorders: Secondary | ICD-10-CM

## 2021-10-18 DIAGNOSIS — Z131 Encounter for screening for diabetes mellitus: Secondary | ICD-10-CM

## 2021-10-21 NOTE — Progress Notes (Signed)
Erroneous encounter-disregard

## 2021-11-01 ENCOUNTER — Encounter: Payer: 59 | Admitting: Family

## 2021-11-01 DIAGNOSIS — Z13228 Encounter for screening for other metabolic disorders: Secondary | ICD-10-CM

## 2021-11-01 DIAGNOSIS — Z1329 Encounter for screening for other suspected endocrine disorder: Secondary | ICD-10-CM

## 2021-11-01 DIAGNOSIS — Z131 Encounter for screening for diabetes mellitus: Secondary | ICD-10-CM

## 2021-11-01 DIAGNOSIS — Z130 Encounter for screening for diseases of the blood and blood-forming organs and certain disorders involving the immune mechanism: Secondary | ICD-10-CM

## 2021-11-01 DIAGNOSIS — Z0000 Encounter for general adult medical examination without abnormal findings: Secondary | ICD-10-CM

## 2021-11-01 DIAGNOSIS — Z13220 Encounter for screening for lipoid disorders: Secondary | ICD-10-CM

## 2021-11-15 ENCOUNTER — Other Ambulatory Visit: Payer: Self-pay

## 2022-03-03 ENCOUNTER — Other Ambulatory Visit: Payer: Self-pay | Admitting: Family

## 2022-03-03 DIAGNOSIS — B009 Herpesviral infection, unspecified: Secondary | ICD-10-CM

## 2022-10-03 ENCOUNTER — Other Ambulatory Visit: Payer: Self-pay

## 2022-10-03 DIAGNOSIS — B009 Herpesviral infection, unspecified: Secondary | ICD-10-CM

## 2022-10-03 MED ORDER — VALACYCLOVIR HCL 500 MG PO TABS: 500.0000 mg | ORAL_TABLET | Freq: Two times a day (BID) | ORAL | 0 refills | Status: DC

## 2023-05-15 ENCOUNTER — Telehealth: Payer: Commercial Managed Care - PPO | Admitting: Physician Assistant

## 2023-05-15 ENCOUNTER — Other Ambulatory Visit: Payer: Self-pay | Admitting: Family

## 2023-05-15 ENCOUNTER — Encounter: Payer: Self-pay | Admitting: Family

## 2023-05-15 DIAGNOSIS — B009 Herpesviral infection, unspecified: Secondary | ICD-10-CM

## 2023-05-15 DIAGNOSIS — A6000 Herpesviral infection of urogenital system, unspecified: Secondary | ICD-10-CM

## 2023-05-15 MED ORDER — VALACYCLOVIR HCL 500 MG PO TABS: 500.0000 mg | ORAL_TABLET | Freq: Two times a day (BID) | ORAL | 0 refills | Status: DC

## 2023-05-15 NOTE — Progress Notes (Signed)
 E-Visit for Herpes Simplex  We are sorry that you are not feeling well.  Here is how we plan to help!  Based on what you have shared ith me, it looks like you may be having an outbreak/flare-up of genital herpes.    I have prescribed I have prescribed Valacyclovir 500 mg Take one by mouth twice a day for 3 days.    If you have been prescribed long term medications to be taken on a regular basis, it is important to follow the recommendations and take them as ordered.    Outbreaks usually include blisters and open sores in the genital area. Outbreaks that happen after the first time are usually not as severe and do not last as long. Genital Herpes Simplex is a commonly sexually transmitted viral infection that is found worldwide. Most of these genital infections are caused by one or two herpes simplex viruses that is passed from person to person during vaginal, oral, or anal sex. Sometimes, people do not know they have herpes because they do not have any symptoms.  Please be aware that if you have genital herpes you can be contagious even when you are not having rash or flare-up and you may not have any symptoms, even when you are taking suppressive medicines.  Herpes cannot be cured. The disease usually causes most problems during the first few years. After that, the virus is still there, but it causes few to no symptoms. Even when the virus is active, people with herpes can take medicines to reduce and help prevent symptoms.  Herpes is an infection that can cause blisters and open sores on the genital area. Herpes is caused by a virus that is passed from person to person during vaginal, oral, or anal sex. Sometimes, people do not know they have herpes because they do not have any symptoms. Herpes cannot be cured. The disease usually causes most problems during the first few years. After that, the virus is still there, but it causes few to no symptoms. Even when the virus is active, people with herpes  can take medicines to reduce and help prevent symptoms.  If you have been prescribed medications to be taken on a regular basis, it is important to follow the recommendations and take them as ordered.  Some people with herpes never have any symptoms. But other people can develop symptoms within a few weeks of being infected with the herpes virus   Symptoms usually include blisters in the genital area. In women, this area includes the vagina, buttocks, anus, or thighs. In men, this area includes the penis, scrotum, anus, butt, or thighs. The blisters can become painful open sores, which then crust over as they heal. Sometimes, people can have other symptoms that include:  ?Blisters on the mouth or lips ?Fever, headache, or pain in the joints ?Trouble urinating  Outbreaks might occur every month or more often, or just once or twice a year. Sometimes, people can tell when an outbreak will occur, because they feel itching or pain beforehand. Sometimes they do not know that an outbreak is coming because they have no symptoms. Whatever your pattern is, keep in mind that herpes outbreaks usually become less frequent over time as you get older. Certain things, called "triggers," can make outbreaks more likely to occur. These include stress, sunlight, menstrual periods,or getting sick.  Antiviral therapy can shorten the duration of symptoms and signs in primary infection, which, when untreated, can be associated with significant increase in the  symptoms of the disease.  HOME CARE Use a portable bath (such as a "Sitz bath") where you can sit in warm water for about 20 minutes. Your bathtub could also work. Avoid bubble baths.  Keep the genital area clean and dry and avoid tight clothes.  Take over-the-counter pain medicine such as acetaminophen (brand name: Tylenol) or ibuprofen sample brand names: Advil, Motrin). But avoid aspirin.  Only take medications as instructed by your medical team.  You are  most likely to spread herpes to a sex partner when you have blisters and open sores on your body. But it's also possible to spread herpes to your partner when you do not have any symptoms. That is because herpes can be present on your body without causing any symptoms, like blisters or pain.  Telling your sex partner that you have herpes can be hard. But it can help protect them, since there are ways to lower the risk of spreading the infection.   Using a condom every time you have sex  Not having sex when you have symptoms  Not having oral sex if you have blisters or open sores (in the genital area or around your mouth)  MAKE SURE YOU   Understand these instructions. Do not have sex without using a condom until you have been seen by a doctor and as instructed by the provider If you are not better or improved within 7 days, you MUST have a follow up at your doctor or the health department for evaluation. There are other causes of rashes in the genital region.  Thank you for choosing an e-visit.  Your e-visit answers were reviewed by a board certified advanced clinical practitioner to complete your personal care plan. Depending upon the condition, your plan could have included both over the counter or prescription medications.  Please review your pharmacy choice. Make sure the pharmacy is open so you can pick up prescription now. If there is a problem, you may contact your provider through [employer] and have the prescription routed to another pharmacy.  Your safety is important to [place]. If you have drug allergies check your prescription carefully.   For the next 24 hours you can use MyChart to ask questions about today's visit, request a non-urgent call back, or ask for a work or school excuse. You will get an email in the next two days asking about your experience. I hope that your e-visit has been valuable and will speed your recovery.   have provided 5 minutes of non face to face  time during this encounter for chart review and documentation.

## 2023-05-15 NOTE — Telephone Encounter (Signed)
Schedule appointment. Last office visit 04/29/2021.

## 2023-05-16 ENCOUNTER — Telehealth: Payer: Commercial Managed Care - PPO

## 2023-05-16 DIAGNOSIS — B009 Herpesviral infection, unspecified: Secondary | ICD-10-CM

## 2023-05-16 MED ORDER — VALACYCLOVIR HCL 500 MG PO TABS: 500.0000 mg | ORAL_TABLET | Freq: Two times a day (BID) | ORAL | 0 refills | Status: AC

## 2023-05-16 NOTE — Progress Notes (Signed)
Virtual Visit Consent   Jonathon Williamson, you are scheduled for a virtual visit with a [facility] provider today. Just as with appointments in the office, your consent must be obtained to participate. Your consent will be active for this visit and any virtual visit you may have with one of our providers in the next 365 days. If you have a MyChart account, a copy of this consent can be sent to you electronically.  As this is a virtual visit, video technology does not allow for your provider to perform a traditional examination. This may limit your provider's ability to fully assess your condition. If your provider identifies any concerns that need to be evaluated in person or the need to arrange testing (such as labs, EKG, etc.), we will make arrangements to do so. Although advances in technology are sophisticated, we cannot ensure that it will always work on either your end or our end. If the connection with a video visit is poor, the visit may have to be switched to a telephone visit. With either a video or telephone visit, we are not always able to ensure that we have a secure connection.  By engaging in this virtual visit, you consent to the provision of healthcare and authorize for your insurance to be billed (if applicable) for the services provided during this visit. Depending on your insurance coverage, you may receive a charge related to this service.  I need to obtain your verbal consent now. Are you willing to proceed with your visit today? Jonathon Williamson has provided verbal consent on 05/16/2023 for a virtual visit (video or telephone). Reed Pandy, [place]  Date: 05/16/2023 11:48 AM  Virtual Visit via Video Note   I, Reed Pandy, connected with  Jonathon Williamson  ([id number], [birth year]) on 05/16/23 at 11:45 AM EST by a video-enabled telemedicine application and verified that I am speaking with the correct person using two identifiers.  Location: Patient: Virtual Visit Location Patient:  Home Provider: Virtual Visit Location Provider: Home Office   I discussed the limitations of evaluation and management by telemedicine and the availability of in person appointments. The patient expressed understanding and agreed to proceed.    History of Present Illness: Jonathon Williamson is a 28 y.o. who identifies as a male who was assigned male at birth, and is being seen today for c/o received his prescription filled when he was living in [place], [place] and now living in [place], [place] and needs his medication refilled.   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   Moderate persistent asthma without complication 11/22/2018   Acute periodontal abscess 02/25/2017   Thrombosed external hemorrhoids 07/29/2012   Allergic rhinitis 04/12/2012    Allergies:  Allergies  Allergen Reactions   Peanut Oil Itching   Penicillins Hives   Atrovent Hfa [Ipratropium Bromide Hfa]    Peanut (Diagnostic)    Medications:  Current Outpatient Medications:    albuterol (VENTOLIN HFA) 108 (90 Base) MCG/ACT inhaler, Inhale 2 puffs into the lungs every 6 (six) hours as needed for wheezing or shortness of breath., Disp: 18 g, Rfl: 1   fluticasone (FLOVENT HFA) 44 MCG/ACT inhaler, Inhale 2 puffs into the lungs daily., Disp: 10.6 g, Rfl: 1   ibuprofen (ADVIL,MOTRIN) 800 MG tablet, Take 800 mg every 8 (eight) hours as needed by mouth., Disp: , Rfl:    valACYclovir (VALTREX) 500 MG tablet, Take 1 tablet (500  mg total) by mouth 2 (two) times daily., Disp: 60 tablet, Rfl: 0  Observations/Objective: Patient is well-developed, well-nourished in no acute distress.  Resting comfortably at home.  Head is normocephalic, atraumatic.  No labored breathing.  Speech is clear and coherent with logical content.  Patient is alert and oriented at baseline.    Assessment and Plan: 1. HSV-2 infection - valACYclovir (VALTREX) 500 MG  tablet; Take 1 tablet (500 mg total) by mouth 2 (two) times daily.  Dispense: 60 tablet; Refill: 0  -Medication refilled today  -Advised Pt he will need to establish with PCP for on going refills. -Pt verbalized understanding.   Follow Up Instructions: I discussed the assessment and treatment plan with the patient. The patient was provided an opportunity to ask questions and all were answered. The patient agreed with the plan and demonstrated an understanding of the instructions.  A copy of instructions were sent to the patient via MyChart unless otherwise noted below.    The patient was advised to call back or seek an in-person evaluation if the symptoms worsen or if the condition fails to improve as anticipated.    Reed Pandy, PA-C

## 2023-05-18 NOTE — Telephone Encounter (Signed)
Valacyclovir prescribed 05/16/2023.

## 2024-03-01 IMAGING — DX DG CHEST 2V
2 series · 2 of 2 positions shown · non-contrast
Comparison: July 21, 2020

CLINICAL DATA: Asthma exacerbation.

EXAM:
CHEST - 2 VIEW

[chest pa]
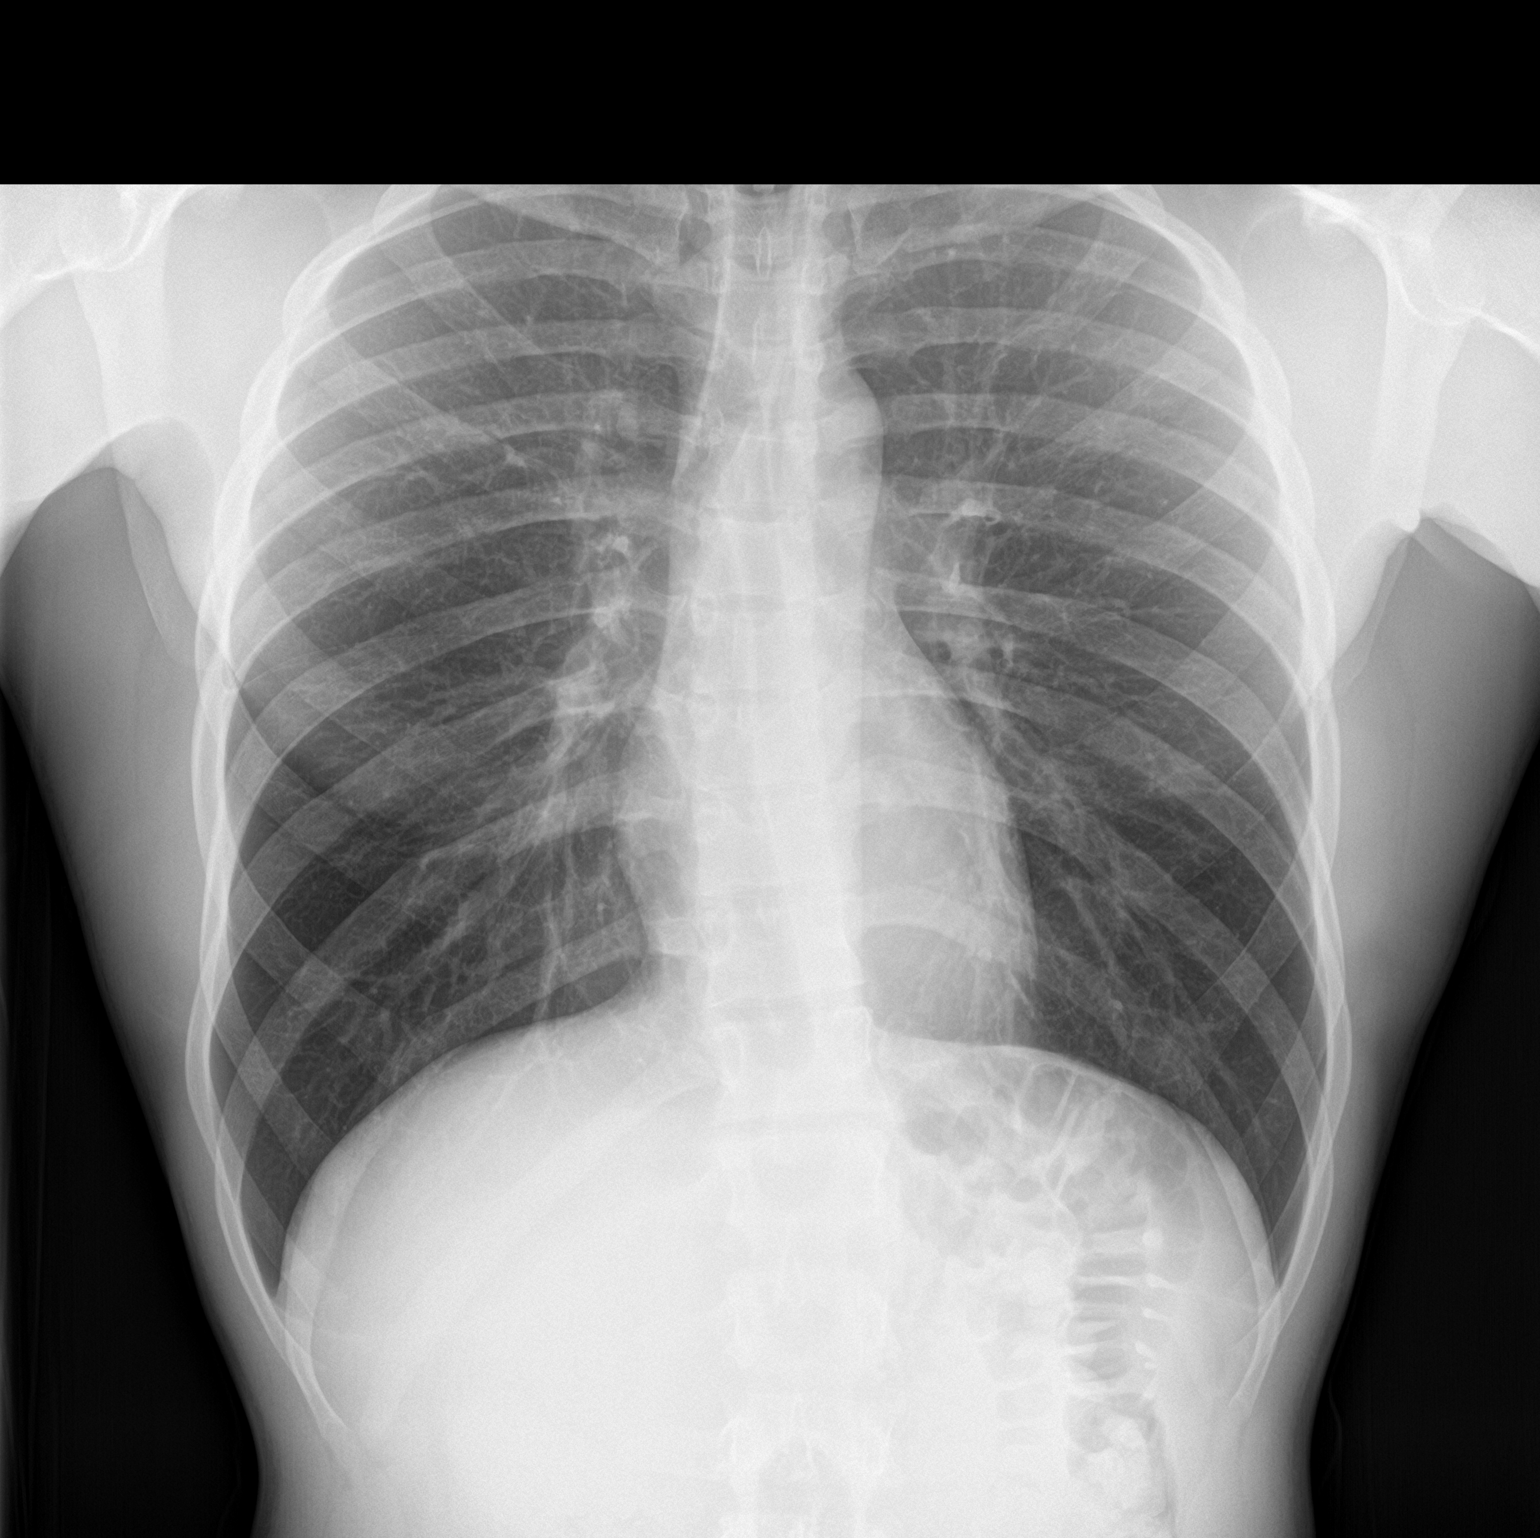

[chest lat]
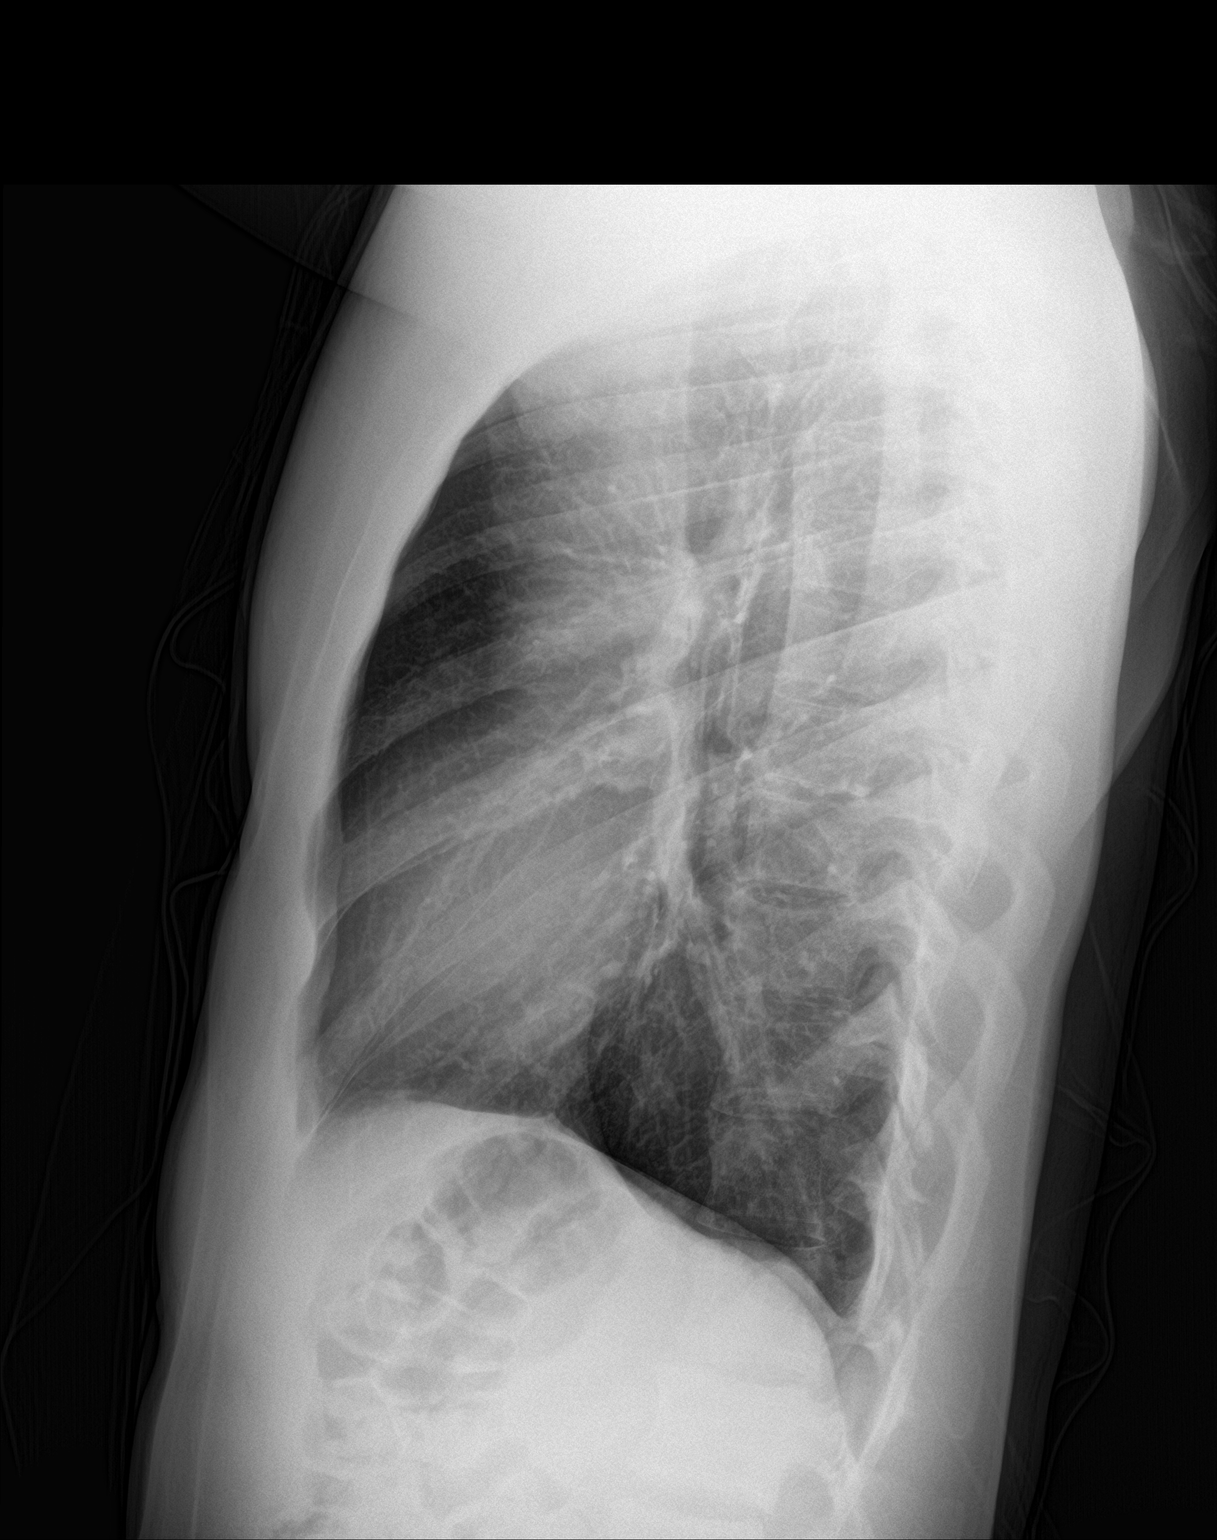

[2 of 2 positions shown; findings below may reference images not displayed]

FINDINGS: The heart size and mediastinal contours are within normal limits.
Both lungs are clear. The visualized skeletal structures are
unremarkable.
IMPRESSION: No active cardiopulmonary disease.
